# Patient Record
Sex: Female | Born: 1985 | Race: Black or African American | Hispanic: No | Marital: Single | Smoking: Never smoker
Health system: Southern US, Community
[De-identification: ages and names within clinical notes are randomized; demographics above are authoritative.]

## PROBLEM LIST (undated history)

## (undated) DIAGNOSIS — O009 Unspecified ectopic pregnancy without intrauterine pregnancy: Secondary | ICD-10-CM

## (undated) HISTORY — PX: ABDOMINAL SURGERY: SHX537

## (undated) HISTORY — PX: BREAST REDUCTION SURGERY: SHX8

---

## 2007-11-03 HISTORY — PX: UNILATERAL SALPINGECTOMY: SHX6160

## 2014-01-16 ENCOUNTER — Emergency Department (HOSPITAL_COMMUNITY)
Admission: EM | Admit: 2014-01-16 | Discharge: 2014-01-16 | Disposition: A | Discharge status: Home / Self Care | Payer: Medicaid Other | Source: Home / Self Care | Attending: Family Medicine | Admitting: Family Medicine

## 2014-01-16 ENCOUNTER — Encounter (HOSPITAL_COMMUNITY): Payer: Self-pay | Admitting: Emergency Medicine

## 2014-01-16 DIAGNOSIS — K0889 Other specified disorders of teeth and supporting structures: Secondary | ICD-10-CM

## 2014-01-16 DIAGNOSIS — K089 Disorder of teeth and supporting structures, unspecified: Secondary | ICD-10-CM

## 2014-01-16 MED ORDER — PENICILLIN V POTASSIUM 500 MG PO TABS
500.0000 mg | ORAL_TABLET | Freq: Three times a day (TID) | ORAL | Status: DC
Start: 2014-01-16 — End: 2014-06-22

## 2014-01-16 MED ORDER — HYDROCODONE-ACETAMINOPHEN 5-325 MG PO TABS: 1.0000 | ORAL_TABLET | Freq: Three times a day (TID) | ORAL | Status: DC | PRN

## 2014-01-16 NOTE — ED Provider Notes (Signed)
CSN: [id number]     Arrival date & time 01/16/14  1924 History   First MD Initiated Contact with Patient 01/16/14 2019     No chief complaint on file.  (Consider location/radiation/quality/duration/timing/severity/associated sxs/prior Treatment) HPI Patient presents to urgent care for complaint of bottom right sided dental pain. Pain started about one month ago but progressively worsening. She has Medicaid [place] [facility] [place] so she has not seen a dentist yet here. She was seen by a dentist in [place] prior to moving who stated she needed teeth pulled on both sides. He pulled the teeth on the left side, but postponed the right sided extraction. She moved prior to returning to the dentist to get the rest of teeth pulled. Tried ibuprofen and Excedrin with no relief. No fevers, no chills, no swelling, no gum bleeding, no pus.   No past medical history on file. No past surgical history on file. No family history on file. History  Substance Use Topics  . Smoking status: Not on file  . Smokeless tobacco: Not on file  . Alcohol Use: Not on file   OB History   No data available     Review of Systems  Constitutional: Negative for fever and chills.  HENT: Positive for dental problem. Negative for congestion, drooling and mouth sores.   Eyes: Negative for visual disturbance.  Respiratory: Negative for cough and shortness of breath.   Cardiovascular: Negative for chest pain and leg swelling.  Gastrointestinal: Negative for abdominal pain.  Genitourinary: Negative for dysuria.  Musculoskeletal: Negative for arthralgias and myalgias.  Skin: Negative for rash.  Neurological: Negative for headaches.    Allergies  Review of patient's allergies indicates not on file.  Home Medications   Current Outpatient Rx  Name  Route  Sig  Dispense  Refill  . HYDROcodone-acetaminophen (NORCO) 5-325 MG per tablet   Oral   Take 1 tablet by mouth every 8 (eight) hours as needed  for moderate pain.   30 tablet   0   . penicillin v potassium (VEETID) 500 MG tablet   Oral   Take 1 tablet (500 mg total) by mouth 3 (three) times daily.   60 tablet   0    BP 126/89  Pulse 100  Temp(Src) 99.3 F (37.4 C) (Oral)  Resp 20 Physical Exam  Constitutional: She is oriented to person, place, and time. She appears well-developed and well-nourished. No distress.  HENT:  Head: Normocephalic and atraumatic.  Mouth/Throat: Oropharynx is clear and moist.  Right lower gum line without redness or swelling. No TTP of jaw. Good ROM of jaw. No LAD.  Eyes: Pupils are equal, round, and reactive to light.  Neck: Normal range of motion. Neck supple.  Cardiovascular: Normal rate, regular rhythm and normal heart sounds.   No murmur heard. Pulmonary/Chest: Effort normal and breath sounds normal. She has no wheezes.  Abdominal: Soft. She exhibits no distension. There is no tenderness.  Musculoskeletal: Normal range of motion. She exhibits no edema and no tenderness.  Neurological: She is alert and oriented to person, place, and time.  Skin: Skin is warm and dry.  Psychiatric: She has a normal mood and affect.    ED Course  Procedures (including critical care time) Labs Review Labs Reviewed - No data to display Imaging Review No results found.  MDM   1. Pain, dental    Patient with acutely worsening chronic dental pain. Has dental appt on April 8. - Pen V  for possible developing periodontal infection given worsening pain - Norco prn pain - Given resource guide to call for an appointment if April 8 is too long to wait - F/u if develops fever, worsening pain, or notices swelling.    Hilarie Fredrickson, MD 01/16/14 [phone]

## 2014-01-16 NOTE — Discharge Instructions (Signed)
Dental Pain °Toothache is pain in or around a tooth. It may get worse with chewing or with cold or heat.  °HOME CARE °· Your dentist may use a numbing medicine during treatment. If so, you may need to avoid eating until the medicine wears off. Ask your dentist about this. °· Only take medicine as told by your dentist or doctor. °· Avoid chewing food near the painful tooth until after all treatment is done. Ask your dentist about this. °GET HELP RIGHT AWAY IF:  °· The problem gets worse or new problems appear. °· You have a fever. °· There is redness and puffiness (swelling) of the face, jaw, or neck. °· You cannot open your mouth. °· There is pain in the jaw. °· There is very bad pain that is not helped by medicine. °MAKE SURE YOU:  °· Understand these instructions. °· Will watch your condition. °· Will get help right away if you are not doing well or get worse. °Document Released: 04/06/2008 Document Revised: 01/11/2012 Document Reviewed: 04/06/2008 °ExitCare® Patient Information ©2014 ExitCare, LLC. ° ° °Emergency Department Resource Guide °1) Find a Doctor and Pay Out of Pocket °Although you won't have to find out who is covered by your insurance plan, it is a good idea to ask around and get recommendations. You will then need to call the office and see if the doctor you have chosen will accept you as a new patient and what types of options they offer for patients who are self-pay. Some doctors offer discounts or will set up payment plans for their patients who do not have insurance, but you will need to ask so you aren't surprised when you get to your appointment. ° °2) Contact Your Local Health Department °Not all health departments have doctors that can see patients for sick visits, but many do, so it is worth a call to see if yours does. If you don't know where your local health department is, you can check in your phone book. The CDC also has a tool to help you locate your state's health department, and many  state websites also have listings of all of their local health departments. ° °3) Find a Walk-in Clinic °If your illness is not likely to be very severe or complicated, you may want to try a walk in clinic. These are popping up all over the country in pharmacies, drugstores, and shopping centers. They're usually staffed by nurse practitioners or physician assistants that have been trained to treat common illnesses and complaints. They're usually fairly quick and inexpensive. However, if you have serious medical issues or chronic medical problems, these are probably not your best option. ° °No Primary Care Doctor: °- Call [facility] at  832-8000 - they can help you locate a primary care doctor that  accepts your insurance, provides certain services, etc. °- Physician Referral Service- 1-800-533-3463 ° °Chronic Pain Problems: °Organization         Address  Phone   Notes  °[facility]  (336) 297-2271 Patients need to be referred by their primary care doctor.  ° °Medication Assistance: °Organization         Address  Phone   Notes  °[place] [facility] [address] °[place], [place] [address] (336) 641-8030 --Must be a resident of [place] °-- Must have NO insurance coverage whatsoever (no Medicaid/ Medicare, etc.) °-- The pt. MUST have a primary care doctor that directs their care regularly and follows   them in the community °  °[facility]  (866) 331-1348   °[employer]  (888) 892-1162   ° °Agencies that provide inexpensive medical care: °Organization         Address  Phone   Notes  °[facility]  (336) 832-8035   °[facility]    (336) 832-7272   °[facility] [address] °[place], [place] [address] (336) 832-4777   °[facility] [address], °[place] (336) 271-4999   °[facility]    (336) 373-0678   °[facility]    (336) 272-1050   °[facility] ° [address], [place] Phone:  (336) 832-4444, Fax:  (336) 832-4440 Hours of Operation:  9 am - 6 pm, M-F.  Also accepts Medicaid/Medicare and self-pay.  °[facility] ° [address], [place] Phone: (336) 832-3150, Fax: (336) 832-3151. Hours of Operation:  8:30 am - 5:30 pm, M-F.  Also accepts Medicaid and self-pay.  °[facility] [place] [address], [place] Phone: (336) 878-6027   °[facility] [address], [place], [place] (336)723-1848, Ext. 123 Mondays & Thursdays: 7-9 AM.  First 15 patients are seen on a first come, first serve basis. °  ° °Medicaid-accepting [place] Providers: ° °Organization         Address  Phone   Notes  °[facility] [address], [place] (336) 641-2100 Also accepts self-pay patients.  °[facility] [address], [place] ° (336) 856-9996   °[facility] [address], [place] (336) 288-8857   °[facility] [address] [place] [address], [place] (336) 299-7000   °Veita Bland [address], [place]  ° (336) 373-1557 Only accepts [place] Access Medicaid patients after they have their name applied to their card.  ° °Self-Pay (no insurance) in [place]: ° °Organization         Address  Phone   Notes  °Sickle Cell Patients, [facility] [address], [place] (336) 832-1970   °[facility] [address], [place] (336) 832-4400   °[facility] ° [address], [place] (336) 992-4800   °[facility]/Dr. Osei-Bonsu ° [address] [place] [address], [place] or [address], [place] (336) 841-8500 Phone number for both [place] and [place] locations is the same.  °[facility] [address], [place] (336) 299-0000   °[facility] [address]  [place] [address], [place] or [address] (336) 852-7530 °(336) 878-2260   °[facility] [address], [place] (336) 350-1642, phone; (336) 294-5005, fax Sees patients 1st and 3rd Saturday of every month.  Must not qualify for public or private insurance (i.e. Medicaid, Medicare, [place] Health Choice, Veterans' Benefits) • Household income should be no more than 200% of the poverty level •The clinic cannot treat you if you are pregnant or think you are pregnant • Sexually transmitted diseases are not treated at the clinic.  ° ° °Dental Care: °Organization         Address  Phone  Notes  °[facility] [facility] [address], [place] (336) 641-6152 Accepts children up to age 21 who are enrolled   in Medicaid or [place] Health Choice; pregnant women with a Medicaid card; and children who have applied for Medicaid or [place] Health Choice, but were declined, whose parents can pay a reduced fee at time of service.  °[facility] [place]  [address], [place] (336) 641-7733 Accepts children up to age 21 who are enrolled in Medicaid or [place] Health Choice; pregnant women with a Medicaid card; and children who have applied for Medicaid or [place] Health Choice, but were declined, whose parents can pay a reduced fee at time of service.  °[facility] ° [address], [place] (336) 641-4533 Patients are seen by appointment only. Walk-ins are not accepted. [facility] will see patients 18 years of age and older. °Monday - Tuesday (8am-5pm) °Most Wednesdays (8:30-5pm) °$30 per visit, cash only  °[facility] ° [address], [place] (336) 641-4533 Patients are seen by appointment only. Walk-ins are not accepted. [facility] will see patients 18 years of age and older. °One Wednesday Evening (Monthly: Volunteer Based).  $30 per visit, cash only  °[facility]  (919) 537-3737 for adults; Children under age 4, call Graduate Pediatric Dentistry at (919) [phone]. Children aged 4-14, please call (919) [phone] to request a pediatric application. ° Dental services are provided in all areas of dental care including fillings, crowns and bridges, complete and partial dentures, implants, gum treatment, root canals, and extractions. Preventive care is also provided. Treatment is provided to both adults and children. °Patients are selected via a lottery and there is often a waiting list. °  °[facility] [address], °[place] ° (336) 763-8833 [email or web address] °  °[facility] [address], [place], [place] (336)723-1848, Ext. 123 Second and Fourth Thursday of each month, opens at 6:30 AM; Clinic ends at 9 AM.  Patients are seen on a first-come first-served basis, and a limited number are seen during each clinic.  ° °[facility] ° [address], [place], [place] (336) 723-7904   Eligibility Requirements °You must have lived in [place], or [place] counties for at least the last three months. °  You cannot be eligible for state or federal sponsored healthcare insurance, including Veterans Administration, Medicaid, or Medicare. °  You generally cannot be eligible for healthcare insurance through your employer.  °  How to apply: °Eligibility screenings are held every Tuesday and Wednesday afternoon from 1:00 pm until 4:00 pm. You do not need an appointment for the interview!  °[facility] [address], [place], [place] 336-631-2330   °[facility] [facility]  336-342-8273   °[facility]  336-703-3100   ° [facility]  336-570-6415   ° °Behavioral Health Resources in the Community: °Intensive Outpatient Programs °Organization         Address  Phone  Notes  °[facility] [address], [place], [place]  336-878-6098   °[facility] [address], [place], [place] 336-832-9800   °[facility] [address], [place], [place] ° 336-882-2125   °[facility] [address],  °[place],  1-800-853-5163 or 336-641-4981   °Substance Abuse Resources °Organization         Address  Phone  Notes  °[facility]  336-882-2125   °[facility]  336-784-9470   °  [facility]  336-285-9073   °[facility]  336-845-3988   °Residential & Outpatient Substance Abuse Program  1-800-659-3381   °Psychological Services °Organization         Address  Phone  Notes  °[facility]  336- 832-9600   °[facility]  336- 378-7881   °[facility] [address], [place] 1-800-853-5163 or 336-641-4981   ° °Mobile Crisis Teams °Organization         Address  Phone  Notes  °[facility], Mobile Crisis Care Unit  1-877-626-1772   °Assertive °[facility] ° [address] [place], [place] 336-834-9664   °Sharon DeEsch [address] °[place] 336-554-5454   ° °Self-Help/Support Groups °Organization         Address  Phone             Notes  °[facility] - variety of support groups  336- 373-1402 Call for more information  °Narcotics Anonymous (NA), [facility] [address], °[place]  2 meetings at this location  ° °Residential Treatment Programs °Organization         Address  Phone  Notes  °[facility] [address],    °[place]  1-866-801-8205   °[facility] ° [address], [place], [place] 704-293-8524   °[facility] [address], [place] 336-845-3988 Admissions: 8am-3pm M-F  °[facility] [address]    °[place], [place] 336-841-1104   °[facility] [address], [place], [place] 336-379-7146   °[facility] [address]    °[place], [place] 336-285-9073   °[facility] Intensive Outpatient [address], [place], [place] 336-852-3033   °[facility] [address]  °[place], [place] 1-877-615-2722 or 336-784-9470   °[facility] ([facility]) [address] [place], [place] 336-227-7417 Accepts Medicaid  °[facility] [address]  ° [place] 1-800-659-3381 Substance Abuse/Addiction Treatment  ° °[place] Behavioral Health Resources °Organization         Address  Phone  Notes  °[facility]  (888) 581-9988   °Julie Brannon, PhD [address] [place], [place]   (336) 349-5553 or (336) 951-0000   °[facility]   [address] °[place], [place] (336) 349-4454   °[facility] [address], [place], [place] (336) 342-8316 Insurance/Medicaid/sponsorship through [facility]  °[facility] [address]                                    [place], [place] (336) 342-8316 Therapy/tele-psych/case  °[facility] [address]  ° [place], [place] (336) 349-2233    °Dr. Arfeen  (336) 349-4544   °[facility] [place]  [employer] [place] [facility] 1) [address], [place] °2) [address], [place] °3)  [address], [place] (336) 349-3220 °(336) 342-7768 ° °(336) 342-8140   °[place] Child Abuse [facility] (336) 342-1394 or (336) 342-3537 (After Hours)    ° ° ° °

## 2014-01-16 NOTE — ED Notes (Signed)
Patient complains of right bottom dental pain

## 2014-01-17 NOTE — ED Provider Notes (Signed)
Medical screening examination/treatment/procedure(s) were performed by a resident physician or non-physician practitioner and as the supervising physician I was immediately available for consultation/collaboration.  Jonne Rote, MD    Lania Zawistowski S Anwar Sakata, MD 01/17/14 0740 

## 2014-06-22 ENCOUNTER — Emergency Department (HOSPITAL_COMMUNITY)
Admission: EM | Admit: 2014-06-22 | Discharge: 2014-06-22 | Disposition: A | Discharge status: Home / Self Care | Payer: Medicaid Other | Source: Home / Self Care | Attending: Family Medicine | Admitting: Family Medicine

## 2014-06-22 ENCOUNTER — Encounter (HOSPITAL_COMMUNITY): Payer: Self-pay | Admitting: Emergency Medicine

## 2014-06-22 DIAGNOSIS — J069 Acute upper respiratory infection, unspecified: Secondary | ICD-10-CM

## 2014-06-22 DIAGNOSIS — R05 Cough: Secondary | ICD-10-CM

## 2014-06-22 DIAGNOSIS — R059 Cough, unspecified: Secondary | ICD-10-CM

## 2014-06-22 DIAGNOSIS — B9789 Other viral agents as the cause of diseases classified elsewhere: Principal | ICD-10-CM

## 2014-06-22 MED ORDER — IPRATROPIUM BROMIDE 0.06 % NA SOLN: 2.0000 | Freq: Four times a day (QID) | NASAL | Status: DC

## 2014-06-22 MED ORDER — GUAIFENESIN-CODEINE 100-10 MG/5ML PO SOLN: 5.0000 mL | Freq: Every evening | ORAL | Status: DC | PRN

## 2014-06-22 MED ORDER — PREDNISONE 10 MG PO TABS: 30.0000 mg | ORAL_TABLET | Freq: Every day | ORAL | Status: DC

## 2014-06-22 MED ORDER — TRAMADOL HCL 50 MG PO TABS: 50.0000 mg | ORAL_TABLET | Freq: Every evening | ORAL | Status: DC | PRN

## 2014-06-22 NOTE — Discharge Instructions (Signed)
Thank you for coming in today. Take prednisone as directed for 5 days. Use Atrovent nasal spray. Use codeine cough medicine for cough as needed. Do not take and try for work. Call or go to the emergency room if you get worse, have trouble breathing, have chest pains, or palpitations.    Cough, Adult  A cough is a reflex that helps clear your throat and airways. It can help heal the body or may be a reaction to an irritated airway. A cough may only last 2 or 3 weeks (acute) or may last more than 8 weeks (chronic).  CAUSES Acute cough:  Viral or bacterial infections. Chronic cough:  Infections.  Allergies.  Asthma.  Post-nasal drip.  Smoking.  Heartburn or acid reflux.  Some medicines.  Chronic lung problems (COPD).  Cancer. SYMPTOMS   Cough.  Fever.  Chest pain.  Increased breathing rate.  High-pitched whistling sound when breathing (wheezing).  Colored mucus that you cough up (sputum). TREATMENT   A bacterial cough may be treated with antibiotic medicine.  A viral cough must run its course and will not respond to antibiotics.  Your caregiver may recommend other treatments if you have a chronic cough. HOME CARE INSTRUCTIONS   Only take over-the-counter or prescription medicines for pain, discomfort, or fever as directed by your caregiver. Use cough suppressants only as directed by your caregiver.  Use a cold steam vaporizer or humidifier in your bedroom or home to help loosen secretions.  Sleep in a semi-upright position if your cough is worse at night.  Rest as needed.  Stop smoking if you smoke. SEEK IMMEDIATE MEDICAL CARE IF:   You have pus in your sputum.  Your cough starts to worsen.  You cannot control your cough with suppressants and are losing sleep.  You begin coughing up blood.  You have difficulty breathing.  You develop pain which is getting worse or is uncontrolled with medicine.  You have a fever. MAKE SURE YOU:   Understand  these instructions.  Will watch your condition.  Will get help right away if you are not doing well or get worse. Document Released: 04/17/2011 Document Revised: 01/11/2012 Document Reviewed: 04/17/2011 [facility] Patient Information 2015 [place] Gardens[place]Care, [place]. This information is not intended to replace advice given to you by your health care provider. Make sure you discuss any questions you have with your health care provider.

## 2014-06-22 NOTE — ED Provider Notes (Addendum)
Annette KearnsSierra Floyd is a 28 y.o. female who presents to Urgent Care today for cough. Patient is a 2 day history of mild dry cough associated with a runny nose. No fevers chills vomiting or diarrhea. Patient has mild nausea and headache. She denies any shortness of breath chest pains or palpitations. She's tried some over-the-counter cough medications which have helped a little bit. Her symptoms are mild to moderate.   History reviewed. No pertinent past medical history. History  Substance Use Topics  . Smoking status: Never Smoker   . Smokeless tobacco: Not on file  . Alcohol Use: No   ROS as above Medications: No current facility-administered medications for this encounter.   Current Outpatient Prescriptions  Medication Sig Dispense Refill  . ipratropium (ATROVENT) 0.06 % nasal spray Place 2 sprays into both nostrils 4 (four) times daily.  15 mL  1  . predniSONE (DELTASONE) 10 MG tablet Take 3 tablets (30 mg total) by mouth daily.  15 tablet  0  . traMADol (ULTRAM) 50 MG tablet Take 1 tablet (50 mg total) by mouth at bedtime as needed (cough).  10 tablet  0    Exam:  BP 114/80  Pulse 103  Temp(Src) 98.5 F (36.9 C) (Oral)  Resp 20  SpO2 100%  LMP 05/29/2014 Gen: Well NAD HEENT: EOMI,  MMM posterior pharynx with cobblestoning. Normal tympanic membranes bilaterally. Lungs: Normal work of breathing. CTABL Heart: RRR no MRG Abd: NABS, Soft. Nondistended, Nontender Exts: Brisk capillary refill, warm and well perfused.   No results found for this or any previous visit (from the past 24 hour(s)). No results found.  Assessment and Plan: 28 y.o. female with viral URI with cough. Plenitude with prednisone, codeine containing cough medication for cough and Atrovent nasal spray.  Discussed warning signs or symptoms. Please see discharge instructions. Patient expresses understanding.   This note was created using Conservation officer, historic buildingsDragon voice recognition software. Any transcription errors are unintended.     Rodolph BongEvan S Maury Bamba, MD 06/22/14 1021  Rodolph BongEvan S Vedanth Sirico, MD 06/22/14 1027

## 2014-06-22 NOTE — ED Notes (Signed)
C/o cold sx onset 2 days Sx include: HA, dry cough, congestion Denies f/v/d Taking OTC cold meds w/no relief Alert, no signs of acute distress.

## 2015-07-12 ENCOUNTER — Encounter (HOSPITAL_COMMUNITY): Payer: Self-pay

## 2015-07-12 ENCOUNTER — Emergency Department (HOSPITAL_COMMUNITY)
Admission: EM | Admit: 2015-07-12 | Discharge: 2015-07-12 | Discharge status: Against Medical Advice | Payer: Medicare Other | Attending: Emergency Medicine | Admitting: Emergency Medicine

## 2015-07-12 DIAGNOSIS — R0602 Shortness of breath: Secondary | ICD-10-CM | POA: Diagnosis not present

## 2015-07-12 DIAGNOSIS — R531 Weakness: Secondary | ICD-10-CM | POA: Insufficient documentation

## 2015-07-12 DIAGNOSIS — R109 Unspecified abdominal pain: Secondary | ICD-10-CM | POA: Insufficient documentation

## 2015-07-12 DIAGNOSIS — R110 Nausea: Secondary | ICD-10-CM | POA: Insufficient documentation

## 2015-07-12 HISTORY — DX: Unspecified ectopic pregnancy without intrauterine pregnancy: O00.90

## 2015-07-12 NOTE — ED Notes (Signed)
Multiple Complaints:  Pt states abdominal pain, bloating, shortness of breath, weakness, nausea.  No fever.  No change in urination.  No vaginal bleeding or discharge.

## 2015-07-12 NOTE — ED Notes (Addendum)
Called pt for lab draw, no answer.   Notified nurse

## 2015-07-12 NOTE — ED Notes (Signed)
Pt continues to not be in lobby.

## 2015-09-16 ENCOUNTER — Encounter (HOSPITAL_COMMUNITY): Payer: Self-pay | Admitting: *Deleted

## 2015-09-16 ENCOUNTER — Emergency Department (HOSPITAL_COMMUNITY)
Admission: EM | Admit: 2015-09-16 | Discharge: 2015-09-16 | Discharge status: Against Medical Advice | Payer: Medicare Other | Attending: Emergency Medicine | Admitting: Emergency Medicine

## 2015-09-16 DIAGNOSIS — R220 Localized swelling, mass and lump, head: Secondary | ICD-10-CM | POA: Insufficient documentation

## 2015-09-16 NOTE — ED Notes (Signed)
Patient left ER prior to being seen.  Patient displayed frustration to staff for being placed in a hall bed.  Patient was then observed leaving the ER.  Patient ambulating with steady gait in no obvious distress.

## 2015-09-16 NOTE — ED Notes (Signed)
Patient is alert and oriented x4.  She is complaining of a mass on the back of her scalp that started growing after in the last Few years.  Over the last few days she has noticed an increase in size and pain along with drainage.  Currently she rates her  Pain 8 of 10.

## 2015-12-16 ENCOUNTER — Emergency Department (HOSPITAL_COMMUNITY)
Admission: EM | Admit: 2015-12-16 | Discharge: 2015-12-17 | Disposition: A | Discharge status: Home / Self Care | Payer: Medicare Other | Attending: Emergency Medicine | Admitting: Emergency Medicine

## 2015-12-16 ENCOUNTER — Encounter (HOSPITAL_COMMUNITY): Payer: Self-pay | Admitting: Emergency Medicine

## 2015-12-16 DIAGNOSIS — Z79899 Other long term (current) drug therapy: Secondary | ICD-10-CM | POA: Diagnosis not present

## 2015-12-16 DIAGNOSIS — Z3A00 Weeks of gestation of pregnancy not specified: Secondary | ICD-10-CM | POA: Insufficient documentation

## 2015-12-16 DIAGNOSIS — O200 Threatened abortion: Secondary | ICD-10-CM | POA: Diagnosis not present

## 2015-12-16 DIAGNOSIS — O209 Hemorrhage in early pregnancy, unspecified: Secondary | ICD-10-CM | POA: Diagnosis present

## 2015-12-16 NOTE — ED Notes (Signed)
Pt states she took a home preg test 2 weeks ago and it was positive. [place] today showed no sac in uterus or fetus. LMP 11/04/15. Hx of ectopic pregnancy with L fallopian tube removal. Vaginal spotting today. RLQ pain. Alert and oriented.

## 2015-12-17 LAB — BASIC METABOLIC PANEL
Anion gap: 9 (ref 5–15)
BUN: 16 mg/dL (ref 6–20)
CO2: 28 mmol/L (ref 22–32)
Calcium: 9.9 mg/dL (ref 8.9–10.3)
Chloride: 103 mmol/L (ref 101–111)
Creatinine, Ser: 0.8 mg/dL (ref 0.44–1.00)
GFR calc Af Amer: >60 mL/min (ref >60)
GFR calc non Af Amer: >60 mL/min (ref >60)
GLUCOSE: 103 mg/dL — AB (ref 65–99)
POTASSIUM: 3.8 mmol/L (ref 3.5–5.1)
Sodium: 140 mmol/L (ref 135–145)

## 2015-12-17 LAB — CBC WITH DIFFERENTIAL/PLATELET
Basophils Absolute: 0 10*3/uL (ref 0.0–0.1)
Basophils Relative: 0 %
Eosinophils Absolute: 0.1 10*3/uL (ref 0.0–0.7)
Eosinophils Relative: 2 %
HCT: 33.8 % — ABNORMAL LOW (ref 36.0–46.0)
Hemoglobin: 11.5 g/dL — ABNORMAL LOW (ref 12.0–15.0)
LYMPHS ABS: 2.5 10*3/uL (ref 0.7–4.0)
LYMPHS PCT: 51 %
MCH: 32.5 pg (ref 26.0–34.0)
MCHC: 34 g/dL (ref 30.0–36.0)
MCV: 95.5 fL (ref 78.0–100.0)
MONOS PCT: 7 %
Monocytes Absolute: 0.4 10*3/uL (ref 0.1–1.0)
Neutro Abs: 2 10*3/uL (ref 1.7–7.7)
Neutrophils Relative %: 40 %
PLATELETS: 268 10*3/uL (ref 150–400)
RBC: 3.54 MIL/uL — AB (ref 3.87–5.11)
RDW: 13.2 % (ref 11.5–15.5)
WBC: 5 10*3/uL (ref 4.0–10.5)

## 2015-12-17 LAB — ABO/RH: ABO/RH(D): B POS

## 2015-12-17 LAB — HCG, QUANTITATIVE, PREGNANCY: hCG, Beta Chain, Quant, S: 2304 m[IU]/mL — ABNORMAL HIGH (ref <5)

## 2015-12-17 NOTE — Discharge Instructions (Signed)
Threatened Miscarriage A threatened miscarriage occurs when you have vaginal bleeding during your first 20 weeks of pregnancy but the pregnancy has not ended. If you have vaginal bleeding during this time, your health care provider will do tests to make sure you are still pregnant. If the tests show you are still pregnant and the developing baby (fetus) inside your womb (uterus) is still growing, your condition is considered a threatened miscarriage. A threatened miscarriage does not mean your pregnancy will end, but it does increase the risk of losing your pregnancy (complete miscarriage). CAUSES  The cause of a threatened miscarriage is usually not known. If you go on to have a complete miscarriage, the most common cause is an abnormal number of chromosomes in the developing baby. Chromosomes are the structures inside cells that hold all your genetic material. Some causes of vaginal bleeding that do not result in miscarriage include:  Having sex.  Having an infection.  Normal hormone changes of pregnancy.  Bleeding that occurs when an egg implants in your uterus. RISK FACTORS Risk factors for bleeding in early pregnancy include:  Obesity.  Smoking.  Drinking excessive amounts of alcohol or caffeine.  Recreational drug use. SIGNS AND SYMPTOMS  Light vaginal bleeding.  Mild abdominal pain or cramps. DIAGNOSIS  If you have bleeding with or without abdominal pain before 20 weeks of pregnancy, your health care provider will do tests to check whether you are still pregnant. One important test involves using sound waves and a computer (ultrasound) to create images of the inside of your uterus. Other tests include an internal exam of your vagina and uterus (pelvic exam) and measurement of your baby's heart rate.  You may be diagnosed with a threatened miscarriage if:  Ultrasound testing shows you are still pregnant.  Your baby's heart rate is strong.  A pelvic exam shows that the  opening between your uterus and your vagina (cervix) is closed.  Your heart rate and blood pressure are stable.  Blood tests confirm you are still pregnant. TREATMENT  No treatments have been shown to prevent a threatened miscarriage from going on to a complete miscarriage. However, the right home care is important.  HOME CARE INSTRUCTIONS   Make sure you keep all your appointments for prenatal care. This is very important.  Get plenty of rest.  Do not have sex or use tampons if you have vaginal bleeding.  Do not douche.  Do not smoke or use recreational drugs.  Do not drink alcohol.  Avoid caffeine. SEEK MEDICAL CARE IF:  You have light vaginal bleeding or spotting while pregnant.  You have abdominal pain or cramping.  You have a fever. SEEK IMMEDIATE MEDICAL CARE IF:  You have heavy vaginal bleeding.  You have blood clots coming from your vagina.  You have severe low back pain or abdominal cramps.  You have fever, chills, and severe abdominal pain. MAKE SURE YOU:  Understand these instructions.  Will watch your condition.  Will get help right away if you are not doing well or get worse.   This information is not intended to replace advice given to you by your health care provider. Make sure you discuss any questions you have with your health care provider.   Document Released: 10/19/2005 Document Revised: 10/24/2013 Document Reviewed: 08/15/2013 Elsevier Interactive Patient Education 2016 Else[employer] Inc.  Human Chorionic Gonadotropin Test Human chorionic gonadotropin (hCG) is a hormone produced during pregnancy by the cells that form the placenta. The placenta is the organ that  grows inside your womb (uterus) to nourish a developing baby. When you are pregnant, hCG starts to appear in your blood about 11 days after conception. It continues to go up for the first 8-11 weeks of pregnancy.  Your hCG level can be measured with several different types of tests. You  may have:  A urine test.  hCG is eliminated from your body by your kidneys, so a urine test is one way to check for this hormone.  A urine test only shows whether there is hCG in your urine. It does not measure how much.  You may have a urine test to find out whether you are pregnant.  A home pregnancy test detects whether there is hCG in your urine.  A qualitative blood test.  Like the urine test, this blood test only shows whether there is hCG in your blood. It does not measure how much.  You may have this type of blood test to find out whether you are pregnant.  A quantitative blood test.  This type of blood test measures the amount of hCG in your blood.  You may have this type of test to diagnose an abnormal pregnancy or determine whether you are at risk of, or have had, a failed pregnancy (miscarriage). PREPARATION FOR TEST For the urine test:  Limit your fluid intake before the urine test as directed by your health care provider.  Collect the sample the first time you urinate in the morning.  Let your health care provider know if you have blood in your urine. This may interfere with the test result. Some medicines may interfere with the urine and blood tests. Let your health care provider know about all the medicines you are taking. No additional preparation is required for the blood test.  RESULTS It is your responsibility to obtain your test results. Ask the lab or department performing the test when and how you will get your results. Talk to your health care provider if you have any questions about your test results. The results of the hCG urine test and the qualitative hCG blood test are either positive or negative. The results of the quantitative hCG blood test are reported as a number. hCG is measured in international units per liter (IU/L). Meaning of Negative Test Results A negative result on a urine or qualitative blood test could mean that you are not pregnant. It  could also mean the test was done too early to detect hCG. If you still have other signs of pregnancy, the test should be repeated. Meaning of Positive Test Results A positive result on the urine or qualitative blood tests means you are most likely pregnant. Your health care provider may confirm your pregnancy with an imaging study of the inside of your uterus at 5-6 weeks (ultrasound).  Range of Normal Values Ranges for normal values for the quantitative hCG blood test may vary among different labs and hospitals. You should always check with your health care provider after having lab work or other tests done to discuss whether your values are considered within normal limits.   Less than 5 IU/L means it is most likely you are not pregnant.  Greater than 25 IU/L means it is most likely you are pregnant. Meaning of Results Outside Normal Value Ranges If your hCG level on the quantitative test is not what would be expected, you may have the test again. It may also be important for your health care provider to know whether your hCG level  goes up or down over time. Common causes of results outside the normal range include:   Being pregnant with twins (hCG level is higher than expected).  Having an ectopic pregnancy (hCG rises more slowly than expected).  Miscarriage (hCG level falls).  Abnormal growths in the womb (hCG level is higher than expected).   This information is not intended to replace advice given to you by your health care provider. Make sure you discuss any questions you have with your health care provider.   Document Released: 11/20/2004 Document Revised: 11/09/2014 Document Reviewed: 01/23/2014 Elsevier Interactive Patient Education [employer]

## 2015-12-17 NOTE — ED Provider Notes (Signed)
CSN: [id number]     Arrival date & time 12/16/15  2228 History   First MD Initiated Contact with Patient 12/17/15 0225     Chief Complaint  Patient presents with  . Vaginal Bleeding     (Consider location/radiation/quality/duration/timing/severity/associated sxs/prior Treatment) HPI  Patient presents to the emergency department with a past medical history of ectopic pregnancy. She took a positive pregnancy test 2 weeks ago. Her last menstrual period was January 2 and it lasted for 5 days. She was seen at [facility] on February 13 due to being high-risk pregnancy and because she's having vaginal bleeding and they noted that they were unable to see an IUP, but did do an ultrasound of her ovaries and did not see sided gestational sac. The patient presents to the emergency today because her bleeding is mildly worse, spotting to her panty liner. She felt like at [facility] that they were rushing and did not look very hard and would like a second opinion.   ROS: The patient denies diaphoresis, fever, headache, weakness (general or focal), confusion, change of vision,  dysphagia, aphagia, shortness of breath,  abdominal pains, nausea, vomiting, diarrhea, lower extremity swelling, rash, neck pain, chest pain   Past Medical History  Diagnosis Date  . Ectopic pregnancy    Past Surgical History  Procedure Laterality Date  . Breast reduction surgery     History reviewed. No pertinent family history. Social History  Substance Use Topics  . Smoking status: Never Smoker   . Smokeless tobacco: None  . Alcohol Use: No   OB History    No data available     Review of Systems Review of Systems All other systems negative except as documented in the HPI. All pertinent positives and negatives as reviewed in the HPI.    Allergies  Review of patient's allergies indicates no known allergies.  Home Medications   Prior to Admission medications   Medication Sig Start Date End Date Taking? Authorizing Provider   acetaminophen (TYLENOL) 500 MG tablet Take 1,000 mg by mouth every 6 (six) hours as needed for headache.   Yes Historical Provider, MD  diphenhydrAMINE (BENADRYL) 25 mg capsule Take 25 mg by mouth every 6 (six) hours as needed for sleep.   Yes Historical Provider, MD  Prenatal Vit-Fe Fumarate-FA (PRENATAL MULTIVITAMIN) TABS tablet Take 1 tablet by mouth daily at 12 noon.   Yes Historical Provider, MD  guaiFENesin-codeine 100-10 MG/5ML syrup Take 5 mLs by mouth at bedtime as needed for cough. Patient not taking: Reported on 12/17/2015 06/22/14   Rodolph Bong, MD  ipratropium (ATROVENT) 0.06 % nasal spray Place 2 sprays into both nostrils 4 (four) times daily. Patient not taking: Reported on 12/17/2015 06/22/14   Rodolph Bong, MD  predniSONE (DELTASONE) 10 MG tablet Take 3 tablets (30 mg total) by mouth daily. Patient not taking: Reported on 12/17/2015 06/22/14   Rodolph Bong, MD   BP 118/80 mmHg  Pulse 85  Temp(Src) 98.1 F (36.7 C) (Oral)  Resp 20  SpO2 100%  LMP 12/05/2015 Physical Exam  Constitutional: She appears well-developed and well-nourished. No distress.  HENT:  Head: Normocephalic and atraumatic.  Eyes: Pupils are equal, round, and reactive to light.  Neck: Normal range of motion. Neck supple.  Cardiovascular: Normal rate and regular rhythm.   Pulmonary/Chest: Effort normal.  Abdominal: Soft.  Neurological: She is alert.  Skin: Skin is warm and dry.  Nursing note and vitals reviewed.   ED Course  Procedures (including critical  care time) Labs Review Labs Reviewed  CBC WITH DIFFERENTIAL/PLATELET - Abnormal; Notable for the following:    RBC 3.54 (*)    Hemoglobin 11.5 (*)    HCT 33.8 (*)    All other components within normal limits  BASIC METABOLIC PANEL - Abnormal; Notable for the following:    Glucose, Bld 103 (*)    All other components within normal limits  HCG, QUANTITATIVE, PREGNANCY - Abnormal; Notable for the following:    hCG, Beta Chain, Quant, S 2304 (*)     All other components within normal limits  POC URINE PREG, ED  ABO/RH    Imaging Review No results found. I have personally reviewed and evaluated these images and lab results as part of my medical decision-making.   EKG Interpretation None      MDM   Final diagnoses:  Threatened miscarriage in early pregnancy   The patient does not appear to be in any distress. She had a tendency quantitative done at [facility] today as well as an ultrasound to evaluate for gestational sac. She is approximated [redacted] weeks along in which case it may be too early to see an IUP or gestational sac or she may have miscarried. A long discussion about how the pregnancy quantitative blood test works and how she will need to have it rechecked in 48 hours for trend to see with a level has gone off her gone down. She voices her understanding and has been given strict return precautions.   Othewrise her labs are unremarkable. Discussed with Dr. Nicanor Alcon who agrees that no further work-up or imaging is needed at this time, pt really needs to make it to appointment on [facility] and has been made aware that she is diagnosed with threatened abortion.  Filed Vitals:   12/16/15 2240  BP: 118/80  Pulse: 85  Temp: 98.1 F (36.7 C)  Resp: [address]       Marlon Pel, PA-C 12/17/15 0344  April Palumbo, MD 12/17/15 [phone]

## 2015-12-28 ENCOUNTER — Inpatient Hospital Stay (HOSPITAL_COMMUNITY)
Admission: AD | Admit: 2015-12-28 | Discharge: 2015-12-28 | Disposition: A | Discharge status: Home / Self Care | Payer: Medicare Other | Source: Ambulatory Visit | Attending: Obstetrics & Gynecology | Admitting: Obstetrics & Gynecology

## 2015-12-28 ENCOUNTER — Inpatient Hospital Stay (HOSPITAL_COMMUNITY): Payer: Medicare Other

## 2015-12-28 ENCOUNTER — Encounter (HOSPITAL_COMMUNITY): Payer: Self-pay | Admitting: *Deleted

## 2015-12-28 ENCOUNTER — Emergency Department (HOSPITAL_COMMUNITY)
Admission: EM | Admit: 2015-12-28 | Discharge: 2015-12-28 | Disposition: A | Discharge status: Home / Self Care | Payer: Medicare Other

## 2015-12-28 DIAGNOSIS — R109 Unspecified abdominal pain: Secondary | ICD-10-CM | POA: Diagnosis not present

## 2015-12-28 DIAGNOSIS — O009 Unspecified ectopic pregnancy without intrauterine pregnancy: Secondary | ICD-10-CM | POA: Insufficient documentation

## 2015-12-28 DIAGNOSIS — O26891 Other specified pregnancy related conditions, first trimester: Secondary | ICD-10-CM | POA: Diagnosis not present

## 2015-12-28 DIAGNOSIS — O9989 Other specified diseases and conditions complicating pregnancy, childbirth and the puerperium: Secondary | ICD-10-CM | POA: Diagnosis not present

## 2015-12-28 DIAGNOSIS — Z79899 Other long term (current) drug therapy: Secondary | ICD-10-CM | POA: Diagnosis not present

## 2015-12-28 DIAGNOSIS — R102 Pelvic and perineal pain: Secondary | ICD-10-CM | POA: Insufficient documentation

## 2015-12-28 DIAGNOSIS — O26899 Other specified pregnancy related conditions, unspecified trimester: Secondary | ICD-10-CM

## 2015-12-28 DIAGNOSIS — R1031 Right lower quadrant pain: Secondary | ICD-10-CM | POA: Diagnosis not present

## 2015-12-28 DIAGNOSIS — Z3A01 Less than 8 weeks gestation of pregnancy: Secondary | ICD-10-CM

## 2015-12-28 DIAGNOSIS — Z9889 Other specified postprocedural states: Secondary | ICD-10-CM | POA: Diagnosis not present

## 2015-12-28 DIAGNOSIS — O3680X0 Pregnancy with inconclusive fetal viability, not applicable or unspecified: Secondary | ICD-10-CM

## 2015-12-28 LAB — URINALYSIS, ROUTINE W REFLEX MICROSCOPIC
BILIRUBIN URINE: NEGATIVE
Glucose, UA: NEGATIVE mg/dL
HGB URINE DIPSTICK: NEGATIVE
Ketones, ur: NEGATIVE mg/dL
Leukocytes, UA: NEGATIVE
NITRITE: NEGATIVE
PROTEIN: NEGATIVE mg/dL
Specific Gravity, Urine: <1.005 — ABNORMAL LOW (ref 1.005–1.030)
pH: 6.5 (ref 5.0–8.0)

## 2015-12-28 LAB — CBC
HEMATOCRIT: 35 % — AB (ref 36.0–46.0)
HEMOGLOBIN: 12.1 g/dL (ref 12.0–15.0)
MCH: 33.2 pg (ref 26.0–34.0)
MCHC: 34.6 g/dL (ref 30.0–36.0)
MCV: 96.2 fL (ref 78.0–100.0)
Platelets: 280 10*3/uL (ref 150–400)
RBC: 3.64 MIL/uL — ABNORMAL LOW (ref 3.87–5.11)
RDW: 13.4 % (ref 11.5–15.5)
WBC: 4.5 10*3/uL (ref 4.0–10.5)

## 2015-12-28 LAB — HCG, QUANTITATIVE, PREGNANCY: hCG, Beta Chain, Quant, S: 478 m[IU]/mL — ABNORMAL HIGH (ref <5)

## 2015-12-28 NOTE — Discharge Instructions (Signed)

## 2015-12-28 NOTE — Progress Notes (Signed)
Judeth Horn NP in to discuss test results and d/c plan with pt. [place] and verbal d/c instructions given and understanding voiced.

## 2015-12-28 NOTE — MAU Provider Note (Signed)
History     CSN: [id number]  Arrival date and time: 12/28/15 2116   First Provider Initiated Contact with Patient 12/28/15 2200       Chief Complaint  Patient presents with  . Abdominal Pain   HPI  Annette Floyd is a 30 y.o. [id number] at [redacted]w[redacted]d who presents for RLQ pain. Is currently being followed at [facility] for ectopic pregnancy. Reports intermittent pelvic pain for the last few weeks. Has opted no treatment for ectopic d/t dropping BHCGs. States tonight abdominal pain became worse after straining for a BM & is concerned that she may have ruptured the pregnancy. States pain is RLQ, sharp, & currently constant. Rates pain 8/10. Has not treated.  Denies n/v/d, fever, vaginal bleeding.    OB History    Gravida Para Term Preterm AB TAB SAB Ectopic Multiple Living   Past Medical History  Diagnosis Date  . Ectopic pregnancy     Past Surgical History  Procedure Laterality Date  . Breast reduction surgery    . Unilateral salpingectomy Left 2009    ectopic    History reviewed. No pertinent family history.  Social History  Substance Use Topics  . Smoking status: Never Smoker   . Smokeless tobacco: None  . Alcohol Use: No    Allergies: No Known Allergies  Prescriptions prior to admission  Medication Sig Dispense Refill Last Dose  . acetaminophen (TYLENOL) 500 MG tablet Take 1,000 mg by mouth every 6 (six) hours as needed for headache.   12/16/2015 at Unknown time  . diphenhydrAMINE (BENADRYL) 25 mg capsule Take 25 mg by mouth every 6 (six) hours as needed for sleep.   Past Week at Unknown time  . guaiFENesin-codeine 100-10 MG/5ML syrup Take 5 mLs by mouth at bedtime as needed for cough. (Patient not taking: Reported on 12/17/2015) 120 mL 0   . ipratropium (ATROVENT) 0.06 % nasal spray Place 2 sprays into both nostrils 4 (four) times daily. (Patient not taking: Reported on 12/17/2015) 15 mL 1   . predniSONE (DELTASONE) 10 MG tablet Take 3 tablets (30 mg total) by  mouth daily. (Patient not taking: Reported on 12/17/2015) 15 tablet 0   . Prenatal Vit-Fe Fumarate-FA (PRENATAL MULTIVITAMIN) TABS tablet Take 1 tablet by mouth daily at 12 noon.   12/16/2015 at Unknown time    Review of Systems  Constitutional: Negative.   Cardiovascular: Negative.   Gastrointestinal: Positive for abdominal pain and constipation. Negative for nausea, vomiting and diarrhea.  Genitourinary: Negative.   Neurological: Negative for loss of consciousness.   Physical Exam   Blood pressure 119/75, pulse 73, temperature 98.1 F (36.7 C), resp. rate 18, height  (1.702 m), weight 173 lb 12.8 oz (78.835 kg), last menstrual period 11/04/2015.  Physical Exam  Nursing note and vitals reviewed. Constitutional: She is oriented to person, place, and time. She appears well-developed and well-nourished. No distress.  HENT:  Head: Normocephalic and atraumatic.  Eyes: Conjunctivae are normal. Right eye exhibits no discharge. Left eye exhibits no discharge. No scleral icterus.  Neck: Normal range of motion.  Cardiovascular: Normal rate, regular rhythm and normal heart sounds.   No murmur heard. Respiratory: Effort normal and breath sounds normal. No respiratory distress. She has no wheezes.  GI: Soft. Bowel sounds are normal. She exhibits no distension. There is no tenderness. There is no rebound and no guarding.  Neurological: She is alert and oriented to person,  place, and time.  Skin: Skin is warm and dry. She is not diaphoretic.  Psychiatric: She has a normal mood and affect. Her behavior is normal. Judgment and thought content normal.    MAU Course  Procedures Results for orders placed or performed during the hospital encounter of 12/28/15 (from the past 24 hour(s))  Urinalysis, Routine w reflex microscopic (not at [facility])     Status: Abnormal   Collection Time: 12/28/15  9:45 PM  Result Value Ref Range   Color, Urine YELLOW YELLOW   APPearance CLEAR CLEAR   Specific Gravity,  Urine <1.005 (L) 1.005 - 1.030   pH 6.5 5.0 - 8.0   Glucose, UA NEGATIVE NEGATIVE mg/dL   Hgb urine dipstick NEGATIVE NEGATIVE   Bilirubin Urine NEGATIVE NEGATIVE   Ketones, ur NEGATIVE NEGATIVE mg/dL   Protein, ur NEGATIVE NEGATIVE mg/dL   Nitrite NEGATIVE NEGATIVE   Leukocytes, UA NEGATIVE NEGATIVE  CBC     Status: Abnormal   Collection Time: 12/28/15  9:53 PM  Result Value Ref Range   WBC 4.5 4.0 - 10.5 K/uL   RBC 3.64 (L) 3.87 - 5.11 MIL/uL   Hemoglobin 12.1 12.0 - 15.0 g/dL   HCT 69.6 (L) 29.5 - 28.4 %   MCV 96.2 78.0 - 100.0 fL   MCH 33.2 26.0 - 34.0 pg   MCHC 34.6 30.0 - 36.0 g/dL   RDW 13.2 44.0 - 10.2 %   Platelets 280 150 - 400 K/uL  hCG, quantitative, pregnancy     Status: Abnormal   Collection Time: 12/28/15  9:53 PM  Result Value Ref Range   hCG, Beta Chain, Quant, S 478 (H) <5 mIU/mL   US Ob Transvaginal  12/28/2015  CLINICAL DATA:  Pregnant patient in first-trimester pregnancy with right lower quadrant and back pain. Pregnancy of unknown location. EXAM: TRANSVAGINAL OB ULTRASOUND TECHNIQUE: Transvaginal ultrasound was performed for complete evaluation of the gestation as well as the maternal uterus, adnexal regions, and pelvic cul-de-sac. COMPARISON:  No images available. Report from ultrasound 12/20/2015 demonstrating hyperechoic ovoid mass in the right adnexa. FINDINGS: Intrauterine gestational sac: Not visualized. Yolk sac:  Not visualized. Embryo:  Not visualized. Cardiac Activity: Not visualized. Maternal uterus/adnexae: The endometrium is thin measuring 3 mm. The right ovary measures 1.4 x 1.9 x 2.0 cm. No fluid in the endometrial canal. Adjacent to the right ovary is an echogenic adnexal mass with central hypoechoic structure measuring 1.8 x 1.3 x 1.2 cm. There is internal blood flow. The left ovary is normal. There is no pelvic free fluid. IMPRESSION: Findings concerning for right adnexal ectopic pregnancy. No evidence of rupture. Critical Value/emergent results were  called by telephone at the time of interpretation on 12/28/2015 at 10:54 pm to NP Judeth Horn , who verbally acknowledged these results. Electronically Signed   By: Rubye Oaks M.D.   On: 12/28/2015 22:55    MDM B positive Per review of Care Everywhere: BHCG has been dropping. Initially 2300, last checked on 2/20 was 979. Ultrasound on 2/17 shows right adnexal mass suspicious for ectopic measuring 1.5x1.4x1.4 Spoke with radiologist tonight who states mass appears to be similar size to previous ultrasound & not concerning for rupture.  Patient reports resolution of pain since returning from ultrasound S/w Dr. Debroah Loop about patient & results. Tonight's BHCG is 478. Considering mass is same size & BHCG is dropping, ok to discharge patient home with strict precautions for return. Patient has f/u appt with [facility] on Monday.  Assessment and Plan  A: 1. Abdominal pain in pregnancy   2. Pregnancy of unknown anatomic location    P; Discharge home Strict ectopic precautions for return to MAU Keep scheduled f/u with Tera Helper 12/28/2015, 10:00 PM

## 2015-12-28 NOTE — MAU Note (Signed)
Pregnant but unsure where pregnancy is. Having pain RLQ, back and pelvic pain. L tube removed from tubal pregnancy. No bleeding

## 2016-02-18 ENCOUNTER — Encounter (HOSPITAL_COMMUNITY): Payer: Self-pay | Admitting: Emergency Medicine

## 2016-02-18 ENCOUNTER — Ambulatory Visit (HOSPITAL_COMMUNITY)
Admission: EM | Admit: 2016-02-18 | Discharge: 2016-02-18 | Disposition: A | Discharge status: Home / Self Care | Payer: Medicare Other | Attending: Family Medicine | Admitting: Family Medicine

## 2016-02-18 DIAGNOSIS — K0889 Other specified disorders of teeth and supporting structures: Secondary | ICD-10-CM | POA: Diagnosis not present

## 2016-02-18 MED ORDER — AMOXICILLIN 500 MG PO CAPS: 500.0000 mg | ORAL_CAPSULE | Freq: Three times a day (TID) | ORAL | Status: DC

## 2016-02-18 MED ORDER — OXYCODONE-ACETAMINOPHEN 10-325 MG PO TABS: 1.0000 | ORAL_TABLET | ORAL | Status: DC | PRN

## 2016-02-18 NOTE — ED Provider Notes (Signed)
CSN: [id number]     Arrival date & time 02/18/16  1404 History   First MD Initiated Contact with Patient 02/18/16 1547     Chief Complaint  Patient presents with  . Dental Pain   (Consider location/radiation/quality/duration/timing/severity/associated sxs/prior Treatment) HPI Dental pain for 2-3 days. OTC meds not helping. Pain score 4. Has appointment with dentist for May. Past Medical History  Diagnosis Date  . Ectopic pregnancy    Past Surgical History  Procedure Laterality Date  . Breast reduction surgery    . Unilateral salpingectomy Left 2009    ectopic   No family history on file. Social History  Substance Use Topics  . Smoking status: Never Smoker   . Smokeless tobacco: None  . Alcohol Use: No   OB History    Gravida Para Term Preterm AB TAB SAB Ectopic Multiple Living   Review of Systems Dental pain Allergies  Review of patient's allergies indicates no known allergies.  Home Medications   Prior to Admission medications   Medication Sig Start Date End Date Taking? Authorizing Provider  Biotin 10 MG CAPS Take 1 tablet by mouth daily.   Yes Historical Provider, MD  amoxicillin (AMOXIL) 500 MG capsule Take 1 capsule (500 mg total) by mouth 3 (three) times daily. 02/18/16   Tharon Aquas, PA  oxyCODONE-acetaminophen (PERCOCET) 10-325 MG tablet Take 1 tablet by mouth every 4 (four) hours as needed for pain. 02/18/16   Tharon Aquas, PA  Prenatal Vit-Fe Fumarate-FA (PRENATAL MULTIVITAMIN) TABS tablet Take 1 tablet by mouth daily.     Historical Provider, MD   Meds Ordered and Administered this Visit  Medications - No data to display  BP 114/81 mmHg  Pulse 71  Temp(Src) 98.3 F (36.8 C) (Oral)  Resp 12  SpO2 100%  LMP 02/14/2016  Breastfeeding? Unknown No data found.   Physical Exam  Constitutional: She is oriented to person, place, and time. She appears well-developed and well-nourished. No distress.  HENT:  Head: Normocephalic  and atraumatic.  Mouth/Throat:    Neck: Normal range of motion. Neck supple.  Pulmonary/Chest: Effort normal.  Musculoskeletal: Normal range of motion.  Neurological: She is alert and oriented to person, place, and time.  Skin: Skin is warm and dry.  Nursing note and vitals reviewed.   ED Course  Procedures (including critical care time)  Labs Review Labs Reviewed - No data to display  Imaging Review No results found.   Visual Acuity Review  Right Eye Distance:   Left Eye Distance:   Bilateral Distance:    Right Eye Near:   Left Eye Near:    Bilateral Near:      Rx: percocet/amoxil   MDM   1. Pain, dental     Patient is reassured that there are no issues that require transfer to higher level of care at this time or additional tests. Patient is advised to continue home symptomatic treatment. Patient is advised that if there are new or worsening symptoms to attend the emergency department, contact primary care provider, or return to UC. Instructions of care provided discharged home in stable condition.    THIS NOTE WAS GENERATED USING A VOICE RECOGNITION SOFTWARE PROGRAM. ALL REASONABLE EFFORTS  WERE MADE TO PROOFREAD THIS DOCUMENT FOR ACCURACY.  I have verbally reviewed the discharge instructions with the patient. A printed AVS was given to the patient.  All questions were answered prior to  discharge.      Tharon AquasFrank C Ines Rebel, PA 02/18/16 [phone]

## 2016-02-18 NOTE — ED Notes (Signed)
PT reports dental pain in upper left jaw. PT reports no previous problems with this tooth. PT has a dental appointment next month. PT reports she took tylenol, motrin, and excedrin in a six hour time period with no relief.

## 2016-02-18 NOTE — Discharge Instructions (Signed)
Dental Pain °Dental pain may be caused by many things, including: °· Tooth decay (cavities or caries). Cavities cause the nerve of your tooth to be open to air and hot or cold temperatures. This can cause pain or discomfort. °· Abscess or infection. A dental abscess is an area that is full of infected pus from a bacterial infection in the inner part of the tooth (pulp). It usually happens at the end of the tooth's root. °· Injury. °· An unknown reason (idiopathic). °Your pain may be mild or severe. It may only happen when: °· You are chewing. °· You are exposed to hot or cold temperature. °· You are eating or drinking sugary foods or beverages, such as: °¨ Soda. °¨ Candy. °Your pain may also be there all of the time. °HOME CARE °Watch your dental pain for any changes. Do these things to lessen your discomfort: °· Take medicines only as told by your dentist. °· If your dentist tells you to take an antibiotic medicine, finish all of it even if you start to feel better. °· Keep all follow-up visits as told by your dentist. This is important. °· Do not apply heat to the outside of your face. °· Rinse your mouth or gargle with salt water if told by your dentist. This helps with pain and swelling. °¨ You can make salt water by adding ¼ tsp of salt to 1 cup of warm water. °· Apply ice to the painful area of your face: °¨ Put ice in a plastic bag. °¨ Place a towel between your skin and the bag. °¨ Leave the ice on for 20 minutes, 2-3 times per day. °· Avoid foods or drinks that cause you pain, such as: °¨ Very hot or very cold foods or drinks. °¨ Sweet or sugary foods or drinks. °GET HELP IF: °· Your pain is not helped with medicines. °· Your symptoms are worse. °· You have new symptoms. °GET HELP RIGHT AWAY IF: °· You cannot open your mouth. °· You are having trouble breathing or swallowing. °· You have a fever. °· Your face, neck, or jaw is puffy (swollen). °  °This information is not intended to replace advice given to  you by your health care provider. Make sure you discuss any questions you have with your health care provider. °  °Document Released: 04/06/2008 Document Revised: 03/05/2015 Document Reviewed: 10/15/2014 °Elsevier Interactive Patient Education ©2016 Elsevier Inc. ° °

## 2016-05-22 ENCOUNTER — Emergency Department (HOSPITAL_COMMUNITY): Payer: Medicare Other

## 2016-05-22 ENCOUNTER — Emergency Department (HOSPITAL_COMMUNITY)
Admission: EM | Admit: 2016-05-22 | Discharge: 2016-05-22 | Disposition: A | Discharge status: Home / Self Care | Payer: Medicare Other | Attending: Emergency Medicine | Admitting: Emergency Medicine

## 2016-05-22 ENCOUNTER — Encounter (HOSPITAL_COMMUNITY): Payer: Self-pay | Admitting: Emergency Medicine

## 2016-05-22 DIAGNOSIS — Z79899 Other long term (current) drug therapy: Secondary | ICD-10-CM | POA: Insufficient documentation

## 2016-05-22 DIAGNOSIS — R079 Chest pain, unspecified: Secondary | ICD-10-CM | POA: Diagnosis present

## 2016-05-22 DIAGNOSIS — Z792 Long term (current) use of antibiotics: Secondary | ICD-10-CM | POA: Diagnosis not present

## 2016-05-22 LAB — CBC
HEMATOCRIT: 34.2 % — AB (ref 36.0–46.0)
HEMOGLOBIN: 11.5 g/dL — AB (ref 12.0–15.0)
MCH: 33.1 pg (ref 26.0–34.0)
MCHC: 33.6 g/dL (ref 30.0–36.0)
MCV: 98.6 fL (ref 78.0–100.0)
PLATELETS: 235 10*3/uL (ref 150–400)
RBC: 3.47 MIL/uL — AB (ref 3.87–5.11)
RDW: 14.2 % (ref 11.5–15.5)
WBC: 4.3 10*3/uL (ref 4.0–10.5)

## 2016-05-22 LAB — BASIC METABOLIC PANEL
ANION GAP: 6 (ref 5–15)
BUN: 17 mg/dL (ref 6–20)
CHLORIDE: 104 mmol/L (ref 101–111)
CO2: 29 mmol/L (ref 22–32)
CREATININE: 0.82 mg/dL (ref 0.44–1.00)
Calcium: 9.2 mg/dL (ref 8.9–10.3)
GFR calc non Af Amer: >60 mL/min (ref >60)
Glucose, Bld: 97 mg/dL (ref 65–99)
POTASSIUM: 4 mmol/L (ref 3.5–5.1)
SODIUM: 139 mmol/L (ref 135–145)

## 2016-05-22 LAB — I-STAT BETA HCG BLOOD, ED (MC, WL, AP ONLY)

## 2016-05-22 LAB — I-STAT TROPONIN, ED: Troponin i, poc: 0 ng/mL (ref 0.00–0.08)

## 2016-05-22 MED ORDER — IOPAMIDOL (ISOVUE-370) INJECTION 76%
100.0000 mL | Freq: Once | INTRAVENOUS | Status: AC | PRN
  Administered 2016-05-22: 80 mL via INTRAVENOUS

## 2016-05-22 NOTE — Discharge Instructions (Signed)
Please seek medical attention if you have any chest pain at rest.   You were seen and evaluated today for your chest pain. Please do not exercise until you are cleared by cardiology. Cardiology would likely to take a low dose aspirin every day until you see them. They should call you on Monday to arrange outpatient follow-up. If he do not hear from them early on Monday please call their office directly. Return for sudden unexplained chest pain at rest or worsening of symptoms.  Nonspecific Chest Pain  Chest pain can be caused by many different conditions. There is always a chance that your pain could be related to something serious, such as a heart attack or a blood clot in your lungs. Chest pain can also be caused by conditions that are not life-threatening. If you have chest pain, it is very important to follow up with your health care provider. CAUSES  Chest pain can be caused by:  Heartburn.  Pneumonia or bronchitis.  Anxiety or stress.  Inflammation around your heart (pericarditis) or lung (pleuritis or pleurisy).  A blood clot in your lung.  A collapsed lung (pneumothorax). It can develop suddenly on its own (spontaneous pneumothorax) or from trauma to the chest.  Shingles infection (varicella-zoster virus).  Heart attack.  Damage to the bones, muscles, and cartilage that make up your chest wall. This can include:  Bruised bones due to injury.  Strained muscles or cartilage due to frequent or repeated coughing or overwork.  Fracture to one or more ribs.  Sore cartilage due to inflammation (costochondritis). RISK FACTORS  Risk factors for chest pain may include:  Activities that increase your risk for trauma or injury to your chest.  Respiratory infections or conditions that cause frequent coughing.  Medical conditions or overeating that can cause heartburn.  Heart disease or family history of heart disease.  Conditions or health behaviors that increase your risk of  developing a blood clot.  Having had chicken pox (varicella zoster). SIGNS AND SYMPTOMS Chest pain can feel like:  Burning or tingling on the surface of your chest or deep in your chest.  Crushing, pressure, aching, or squeezing pain.  Dull or sharp pain that is worse when you move, cough, or take a deep breath.  Pain that is also felt in your back, neck, shoulder, or arm, or pain that spreads to any of these areas. Your chest pain may come and go, or it may stay constant. DIAGNOSIS Lab tests or other studies may be needed to find the cause of your pain. Your health care provider may have you take a test called an ambulatory ECG (electrocardiogram). An ECG records your heartbeat patterns at the time the test is performed. You may also have other tests, such as:  Transthoracic echocardiogram (TTE). During echocardiography, sound waves are used to create a picture of all of the heart structures and to look at how blood flows through your heart.  Transesophageal echocardiogram (TEE).This is a more advanced imaging test that obtains images from inside your body. It allows your health care provider to see your heart in finer detail.  Cardiac monitoring. This allows your health care provider to monitor your heart rate and rhythm in real time.  Holter monitor. This is a portable device that records your heartbeat and can help to diagnose abnormal heartbeats. It allows your health care provider to track your heart activity for several days, if needed.  Stress tests. These can be done through exercise or by taking  medicine that makes your heart beat more quickly.  Blood tests.  Imaging tests. TREATMENT  Your treatment depends on what is causing your chest pain. Treatment may include:  Medicines. These may include:  Acid blockers for heartburn.  Anti-inflammatory medicine.  Pain medicine for inflammatory conditions.  Antibiotic medicine, if an infection is present.  Medicines to  dissolve blood clots.  Medicines to treat coronary artery disease.  Supportive care for conditions that do not require medicines. This may include:  Resting.  Applying heat or cold packs to injured areas.  Limiting activities until pain decreases. HOME CARE INSTRUCTIONS  If you were prescribed an antibiotic medicine, finish it all even if you start to feel better.  Avoid any activities that bring on chest pain.  Do not use any tobacco products, including cigarettes, chewing tobacco, or electronic cigarettes. If you need help quitting, ask your health care provider.  Do not drink alcohol.  Take medicines only as directed by your health care provider.  Keep all follow-up visits as directed by your health care provider. This is important. This includes any further testing if your chest pain does not go away.  If heartburn is the cause for your chest pain, you may be told to keep your head raised (elevated) while sleeping. This reduces the chance that acid will go from your stomach into your esophagus.  Make lifestyle changes as directed by your health care provider. These may include:  Getting regular exercise. Ask your health care provider to suggest some activities that are safe for you.  Eating a heart-healthy diet. A registered dietitian can help you to learn healthy eating options.  Maintaining a healthy weight.  Managing diabetes, if necessary.  Reducing stress. SEEK MEDICAL CARE IF:  Your chest pain does not go away after treatment.  You have a rash with blisters on your chest.  You have a fever. SEEK IMMEDIATE MEDICAL CARE IF:   Your chest pain is worse.  You have an increasing cough, or you cough up blood.  You have severe abdominal pain.  You have severe weakness.  You faint.  You have chills.  You have sudden, unexplained chest discomfort.  You have sudden, unexplained discomfort in your arms, back, neck, or jaw.  You have shortness of breath at  any time.  You suddenly start to sweat, or your skin gets clammy.  You feel nauseous or you vomit.  You suddenly feel light-headed or dizzy.  Your heart begins to beat quickly, or it feels like it is skipping beats. These symptoms may represent a serious problem that is an emergency. Do not wait to see if the symptoms will go away. Get medical help right away. Call your local emergency services (911 in the [place] Do not drive yourself to the hospital.   This information is not intended to replace advice given to you by your health care provider. Make sure you discuss any questions you have with your health care provider.   Document Released: 07/29/2005 Document Revised: 11/09/2014 Document Reviewed: 05/25/2014 Elsevier Interactive Patient Education [employer]2016 Elsevier Inc.

## 2016-05-22 NOTE — ED Notes (Signed)
Per pt, states chest pain under left breast-states increases when she exercises-has been going on for 2 months-worse past week

## 2016-05-22 NOTE — ED Provider Notes (Signed)
CSN: [id number]     Arrival date & time 05/22/16  1304 History   First MD Initiated Contact with Patient 05/22/16 1421     Chief Complaint  Patient presents with  . Chest Pain     (Consider location/radiation/quality/duration/timing/severity/associated sxs/prior Treatment) HPI Comments: 30 year old female presents for chest pain. The patient reports that over the last 2 months she has had pain in the left side of her chest. She states that initially was coming after she was walking in that it was mild. She reports that recently she started exercising more vigorously and the pain has gotten worse. It usually occurs after she has started exercising. She denies shortness of breath. She does report that she recently had abdominoplasty. She denies having chest pain like this previously. No fevers or chills. No cough. She presently does not have chest pain but states that if she walked around the emergency department she would develop chest pain. She reports that she had a cousin who died suddenly on the basketball court in his late teens/early 42s, heart-related issue. She also had an uncle who died at an early age from a heart condition.   Past Medical History  Diagnosis Date  . Ectopic pregnancy    Past Surgical History  Procedure Laterality Date  . Breast reduction surgery    . Unilateral salpingectomy Left 2009    ectopic   No family history on file. Social History  Substance Use Topics  . Smoking status: Never Smoker   . Smokeless tobacco: None  . Alcohol Use: No   OB History    Gravida Para Term Preterm AB TAB SAB Ectopic Multiple Living   Review of Systems  Constitutional: Negative for fever, chills, appetite change and fatigue.  HENT: Negative for congestion, postnasal drip and rhinorrhea.   Eyes: Negative for visual disturbance.  Respiratory: Negative for cough, chest tightness and shortness of breath.   Cardiovascular: Positive for chest pain.  Negative for palpitations and leg swelling.  Gastrointestinal: Negative for nausea, vomiting, abdominal pain and diarrhea.  Genitourinary: Negative for dysuria, urgency and hematuria.  Musculoskeletal: Negative for myalgias and back pain.  Skin: Negative for rash.  Neurological: Negative for dizziness, weakness, light-headedness and headaches.  Hematological: Does not bruise/bleed easily.      Allergies  Review of patient's allergies indicates no known allergies.  Home Medications   Prior to Admission medications   Medication Sig Start Date End Date Taking? Authorizing Provider  amoxicillin (AMOXIL) 500 MG capsule Take 1 capsule (500 mg total) by mouth 3 (three) times daily. 02/18/16  Yes Tharon Aquas, PA  norgestimate-ethinyl estradiol (MONONESSA) 0.25-35 MG-MCG tablet Take 1 tablet by mouth daily.   Yes Historical Provider, MD  oxyCODONE-acetaminophen (PERCOCET) 10-325 MG tablet Take 1 tablet by mouth every 4 (four) hours as needed for pain. Patient not taking: Reported on 05/22/2016 02/18/16   Tharon Aquas, PA   BP 119/87 mmHg  Pulse 71  Temp(Src) 98.4 F (36.9 C) (Oral)  Resp 18  Ht  (1.702 m)  Wt 175 lb (79.379 kg)  BMI 27.40 kg/m2  SpO2 99%  LMP 05/08/2016 Physical Exam  Constitutional: She is oriented to person, place, and time. She appears well-developed and well-nourished. No distress.  HENT:  Head: Normocephalic and atraumatic.  Right Ear: External ear normal.  Left Ear: External ear normal.  Nose: Nose normal.  Mouth/Throat: Oropharynx is clear and moist. No  oropharyngeal exudate.  Eyes: EOM are normal. Pupils are equal, round, and reactive to light.  Neck: Normal range of motion. Neck supple.  Cardiovascular: Normal rate, regular rhythm, normal heart sounds and intact distal pulses.   No murmur heard. Pulmonary/Chest: Effort normal. No respiratory distress. She has no wheezes. She has no rales. She exhibits no tenderness and no crepitus. Right breast  exhibits no inverted nipple, no mass, no nipple discharge, no skin change and no tenderness. Left breast exhibits no inverted nipple, no mass, no nipple discharge, no skin change and no tenderness. Breasts are symmetrical.  Abdominal: Soft. She exhibits no distension. There is no tenderness.  Musculoskeletal: Normal range of motion. She exhibits no edema or tenderness.  Neurological: She is alert and oriented to person, place, and time.  Skin: Skin is warm and dry. No rash noted. She is not diaphoretic.  Vitals reviewed.   ED Course  Procedures (including critical care time) Labs Review Labs Reviewed  CBC - Abnormal; Notable for the following:    RBC 3.47 (*)    Hemoglobin 11.5 (*)    HCT 34.2 (*)    All other components within normal limits  BASIC METABOLIC PANEL  I-STAT TROPOININ, ED  I-STAT BETA HCG BLOOD, ED ([facility], [facility], [facility] ONLY)    Imaging Review Dg Chest 2 View  05/22/2016  CLINICAL DATA:  Chest pain under left breast, intermittent for months, now worse. Hernia repair 6 weeks ago. EXAM: CHEST  2 VIEW COMPARISON:  None. FINDINGS: Cardiomediastinal silhouette is normal in size and configuration. Lungs are clear. Lung volumes are normal. No evidence of pneumonia. No pleural effusion. No pneumothorax. Osseous and soft tissue structures about the chest are unremarkable. IMPRESSION: Normal chest x-ray. Electronically Signed   By: Bary Richard M.D.   On: 05/22/2016 14:09   Ct Angio Chest Pe W/cm &/or Wo Cm  05/22/2016  CLINICAL DATA:  Chest pain primarily under the left breast. Evaluate for pulmonary embolism. EXAM: CT ANGIOGRAPHY CHEST WITH CONTRAST TECHNIQUE: Multidetector CT imaging of the chest was performed using the standard protocol during bolus administration of intravenous contrast. Multiplanar CT image reconstructions and MIPs were obtained to evaluate the vascular anatomy. CONTRAST:  80 cc Isovue 370 COMPARISON:  Chest radiograph - 05/22/2016 FINDINGS: Vascular Findings: There is  adequate opacification of the pulmonary arterial system with the main pulmonary artery measuring 242 Hounsfield units. There are no discrete filling defects within the pulmonary arterial tree to suggest pulmonary embolism. Normal caliber the main pulmonary artery. Normal heart size.  No pericardial effusion. Normal caliber of the thoracic aorta. No definite thoracic aortic dissection is nongated examination. Conventional configuration of the aortic arch. Incidental note is made of an azygos nipple. Review of the MIP images confirms the above findings. ---------------------------------------------------------------------------------- Nonvascular Findings: Mediastinum/Lymph Nodes: No bulky mediastinal, hilar axillary lymphadenopathy. Lungs/Pleura: Minimal dependent bilateral subpleural ground-glass atelectasis. No discrete focal airspace opacities. No pleural effusion or pneumothorax. The central pulmonary airways appear widely patent. No discrete pulmonary nodules. Upper abdomen: Limited early arterial phase evaluation of the upper abdomen is normal. Musculoskeletal: No acute or aggressive osseous abnormalities. Regional soft tissues appear normal with special attention paid to the caudal aspect of the left breast at the reported area of the patient's discomfort. Palpable. Normal appearance of the thyroid gland. IMPRESSION: No acute cardiopulmonary disease. Specifically, no evidence of pulmonary embolism. Electronically Signed   By: Simonne Come M.D.   On: 05/22/2016 15:50   I have personally reviewed and evaluated these images  and lab results as part of my medical decision-making.   EKG Interpretation   Date/Time:  Friday May 22 2016 13:12:14 EDT Ventricular Rate:  84 PR Interval:    QRS Duration: 70 QT Interval:  374 QTC Calculation: 443 R Axis:   58 Text Interpretation:  Sinus rhythm No previous ECGs available Confirmed by  Allex Madia ([id number]) on 05/22/2016 2:22:52 PM      MDM  Patient was  seen and evaluated in stable condition. Laboratory studies were unremarkable. EKG without acute ischemic changes. CTA negative for PE or other acute process. Concern for possible underlying heart disease in light of family history of sudden cardiac death in young individuals although no first-degree relatives. Discuss case with the cardiology PA on call who reportedly discussed the case with the cardiologist. I was interested in close follow-up for the patient and likely a 2-D echo to further evaluate the patient's heart structure. They said that they could help to facilitate outpatient follow-up and that the office should call the patient on Monday. I instructed the patient not to exercise her take part in strenuous activity until cleared by cardiology. She was given strict return precautions. She expressed understanding and agreement with plan of care. Final diagnoses:  None    1.  Chest pain    Leta BaptistEmily Roe Shunta Mclaurin, MD 05/23/16 [phone]

## 2016-05-29 ENCOUNTER — Encounter: Payer: Self-pay | Admitting: Cardiology

## 2016-05-31 NOTE — Progress Notes (Signed)
Cardiology Office Note   Date:  06/01/2016   ID:  Annette Floyd, DOB Mar 17, 1986, MRN [id number]  PCP:  Ok Anis, MD  Cardiologist:   Rollene Rotunda, MD   Chief Complaint  Patient presents with  . Chest Pain      History of Present Illness: Annette Floyd is a 30 y.o. female who presents for evaluation of chest pain.  The patient was in the ED recently with chest pain.  I reviewed these records.  She had a negative CT and had negative enzymes.  She reports that the symptoms have been going on for about 2 months.  They come and go and might happen for a few weeks and then go away.   She reports sharpt shooting pain.    These could be 8 out of 10 in intensity and feel like contractions.  She says they are under the left breast.  There is no radiation.  There are no associated symptoms.  They do not happen with exercise and she was able to go running yesterday without bringing on these symptoms.  She has had no prior cardiac work up and no past history.  She has no significant risk factors.    Past Medical History:  Diagnosis Date  . Ectopic pregnancy     Past Surgical History:  Procedure Laterality Date  . ABDOMINAL SURGERY     Tummy tuck  . BREAST REDUCTION SURGERY    . UNILATERAL SALPINGECTOMY Left 2009   ectopic     No current outpatient prescriptions on file.   No current facility-administered medications for this visit.     Allergies:   Review of patient's allergies indicates no known allergies.    Social History:  The patient  reports that she has never smoked. She does not have any smokeless tobacco history on file. She reports that she does not drink alcohol or use drugs.   Family History:  The patient's.family history is for the most part not significant.  Dads brother with pacemaker.    ROS:  Please see the history of present illness.   Otherwise, review of systems are positive for none.   All other systems are reviewed and negative.    PHYSICAL  EXAM: VS:  BP 105/72   Pulse 85   Ht  (1.702 m)   Wt 186 lb 9.6 oz (84.6 kg)   LMP 05/08/2016 Comment: negative HCG blood test 05-22-2016  BMI 29.23 kg/m  , BMI Body mass index is 29.23 kg/m. GENERAL:  Well appearing HEENT:  Pupils equal round and reactive, fundi not visualized, oral mucosa unremarkable NECK:  No jugular venous distention, waveform within normal limits, carotid upstroke brisk and symmetric, no bruits, no thyromegaly LYMPHATICS:  No cervical, inguinal adenopathy LUNGS:  Clear to auscultation bilaterally BACK:  No CVA tenderness CHEST:  Unremarkable HEART:  PMI not displaced or sustained,S1 and S2 within normal limits, no S3, no S4, no clicks, no rubs, no murmurs ABD:  Flat, positive bowel sounds normal in frequency in pitch, no bruits, no rebound, no guarding, no midline pulsatile mass, no hepatomegaly, no splenomegaly EXT:  2 plus pulses throughout, no edema, no cyanosis no clubbing SKIN:  No rashes no nodules NEURO:  Cranial nerves II through XII grossly intact, motor grossly intact throughout PSYCH:  Cognitively intact, oriented to person place and time    EKG:  EKG is not ordered today. The ekg ordered 7/21 demonstrates sinus rhythm, rate 84, axis within normal limits, intervals  within normal limits, no acute ST-T wave changes.   Recent Labs: 05/22/2016: BUN 17; Creatinine, Ser 0.82; Hemoglobin 11.5; Platelets 235; Potassium 4.0; Sodium 139    Lipid Panel No results found for: CHOL, TRIG, HDL, CHOLHDL, VLDL, LDLCALC, LDLDIRECT    Wt Readings from Last 3 Encounters:  06/01/16 186 lb 9.6 oz (84.6 kg)  05/22/16 175 lb (79.4 kg)  12/28/15 173 lb 12.8 oz (78.8 kg)      Other studies Reviewed: Additional studies/ records that were reviewed today include: ED records. . Review of the above records demonstrates:  Please see elsewhere in the note.     ASSESSMENT AND PLAN:  CHEST PAIN:  The pretest probability of obstructive CAD is very low.  She has  very atypical symptoms and no risk factors.  The exam and EKG are negative.  No further cardiovascular testing is indicated.  She should go back to her PCP and discuss possible GI etiologies.    RISK REDUCTION:  I did suggest a lipid profile for baseline but will defer to Ok Anis, MD   Current medicines are reviewed at length with the patient today.  The patient does not have concerns regarding medicines.  The following changes have been made:  no change  Labs/ tests ordered today include: None No orders of the defined types were placed in this encounter.    Disposition:   FU with me as needed.      Signed, Rollene Rotunda, MD  06/01/2016 8:45 AM    [facility]

## 2016-06-01 ENCOUNTER — Encounter: Payer: Self-pay | Admitting: Cardiology

## 2016-06-01 ENCOUNTER — Ambulatory Visit (HOSPITAL_COMMUNITY)
Admission: EM | Admit: 2016-06-01 | Discharge: 2016-06-01 | Disposition: A | Discharge status: Home / Self Care | Payer: Medicare Other | Attending: Family Medicine | Admitting: Family Medicine

## 2016-06-01 ENCOUNTER — Encounter (HOSPITAL_COMMUNITY): Payer: Self-pay | Admitting: Emergency Medicine

## 2016-06-01 ENCOUNTER — Ambulatory Visit (INDEPENDENT_AMBULATORY_CARE_PROVIDER_SITE_OTHER): Payer: Medicare Other | Admitting: Cardiology

## 2016-06-01 VITALS — BP 105/72 | HR 85 | Ht 67.0 in | Wt 186.6 lb

## 2016-06-01 DIAGNOSIS — K0889 Other specified disorders of teeth and supporting structures: Secondary | ICD-10-CM | POA: Diagnosis not present

## 2016-06-01 DIAGNOSIS — R0789 Other chest pain: Secondary | ICD-10-CM | POA: Diagnosis not present

## 2016-06-01 DIAGNOSIS — R079 Chest pain, unspecified: Secondary | ICD-10-CM | POA: Insufficient documentation

## 2016-06-01 DIAGNOSIS — K047 Periapical abscess without sinus: Secondary | ICD-10-CM | POA: Diagnosis not present

## 2016-06-01 MED ORDER — NAPROXEN 500 MG PO TABS: 500.0000 mg | ORAL_TABLET | Freq: Two times a day (BID) | ORAL | 0 refills | Status: DC

## 2016-06-01 MED ORDER — KETOROLAC TROMETHAMINE 60 MG/2ML IM SOLN
INTRAMUSCULAR | Status: AC
  Filled 2016-06-01: qty 2

## 2016-06-01 MED ORDER — AMOXICILLIN 875 MG PO TABS: 875.0000 mg | ORAL_TABLET | Freq: Two times a day (BID) | ORAL | 0 refills | Status: DC

## 2016-06-01 MED ORDER — KETOROLAC TROMETHAMINE 60 MG/2ML IM SOLN
60.0000 mg | Freq: Once | INTRAMUSCULAR | Status: AC
  Administered 2016-06-01: 60 mg via INTRAMUSCULAR

## 2016-06-01 MED ORDER — FLUCONAZOLE 150 MG PO TABS: 150.0000 mg | ORAL_TABLET | Freq: Every day | ORAL | 0 refills | Status: DC

## 2016-06-01 NOTE — ED Provider Notes (Signed)
CSN: [id number]     Arrival date & time 06/01/16  1352 History   None    Chief Complaint  Patient presents with  . Dental Pain   (Consider location/radiation/quality/duration/timing/severity/associated sxs/prior Treatment) Patient has left upper jaw and tooth pain for 2 days.   The history is provided by the patient.  Dental Pain  Location:  Upper Upper teeth location:  16/LU 3rd molar Severity:  Moderate Onset quality:  Gradual Duration:  10 days Timing:  Constant Progression:  Worsening Chronicity:  New Previous work-up:  Dental exam Relieved by:  None tried Worsened by:  Nothing Ineffective treatments:  None tried   Past Medical History:  Diagnosis Date  . Ectopic pregnancy    Past Surgical History:  Procedure Laterality Date  . ABDOMINAL SURGERY     Tummy tuck  . BREAST REDUCTION SURGERY    . UNILATERAL SALPINGECTOMY Left 2009   ectopic   No family history on file. Social History  Substance Use Topics  . Smoking status: Never Smoker  . Smokeless tobacco: Never Used  . Alcohol use No   OB History    Gravida Para Term Preterm AB Living   4 2 1 1 1 1    SAB TAB Ectopic Multiple Live Births       1   1     Review of Systems  Constitutional: Negative.   HENT: Positive for dental problem.   Eyes: Negative.   Respiratory: Negative.   Cardiovascular: Negative.   Gastrointestinal: Negative.   Endocrine: Negative.   Genitourinary: Negative.   Musculoskeletal: Negative.   Skin: Negative.   Allergic/Immunologic: Negative.   Neurological: Negative.   Hematological: Negative.   Psychiatric/Behavioral: Negative.     Allergies  Review of patient's allergies indicates no known allergies.  Home Medications   Prior to Admission medications   Medication Sig Start Date End Date Taking? Authorizing Provider  acetaminophen (TYLENOL) 325 MG tablet Take 650 mg by mouth every 6 (six) hours as needed.   Yes Historical Provider, MD  amoxicillin (AMOXIL) 875 MG  tablet Take 1 tablet (875 mg total) by mouth 2 (two) times daily. 06/01/16   Deatra Canter, FNP  naproxen (NAPROSYN) 500 MG tablet Take 1 tablet (500 mg total) by mouth 2 (two) times daily with a meal. 06/01/16   Deatra Canter, FNP   Meds Ordered and Administered this Visit   Medications  ketorolac (TORADOL) injection 60 mg (not administered)    BP 113/85 (BP Location: Right Arm)   Pulse 78   Temp 98.3 F (36.8 C) (Oral)   Resp 12   LMP 05/08/2016 Comment: negative HCG blood test 05-22-2016  SpO2 96%  No data found.   Physical Exam  Urgent Care Course   Clinical Course    Procedures (including critical care time)  Labs Review Labs Reviewed - No data to display  Imaging Review No results found.   Visual Acuity Review  Right Eye Distance:   Left Eye Distance:   Bilateral Distance:    Right Eye Near:   Left Eye Near:    Bilateral Near:         MDM   1. Dental abscess   2. Tooth pain    toradol 60mg  IM Naprosyn 500mg  one po bid prn pain #20 Amoxicillin 875mg  one po bid x 10 days #20     Deatra Canter, FNP 06/01/16 1538

## 2016-06-01 NOTE — Patient Instructions (Signed)
Your physician recommends that you schedule a follow-up appointment in: As Needed    

## 2016-06-01 NOTE — ED Triage Notes (Signed)
Patient reports she is scheduled for a root canal September 28.  Patient has had discomfort for 1 1/2 weeks.  Patient reports pain left upper and left lower.

## 2016-06-01 NOTE — ED Notes (Signed)
On discharge requested script for possible yeast infection.  notified bill oxford, np of this request.

## 2016-08-05 IMAGING — US US OB TRANSVAGINAL
1 series · 15 of 26 positions shown · non-contrast
Comparison: No images available.

CLINICAL DATA: Pregnant patient in first-trimester pregnancy with
right lower quadrant and back pain. Pregnancy of unknown location.

EXAM:
TRANSVAGINAL OB ULTRASOUND
TECHNIQUE: Transvaginal ultrasound was performed for complete evaluation of the
gestation as well as the maternal uterus, adnexal regions, and
pelvic cul-de-sac.

[Series 1: us ob transvaginal · 26 acquisitions, 15 frames shown]
[im 1/26]
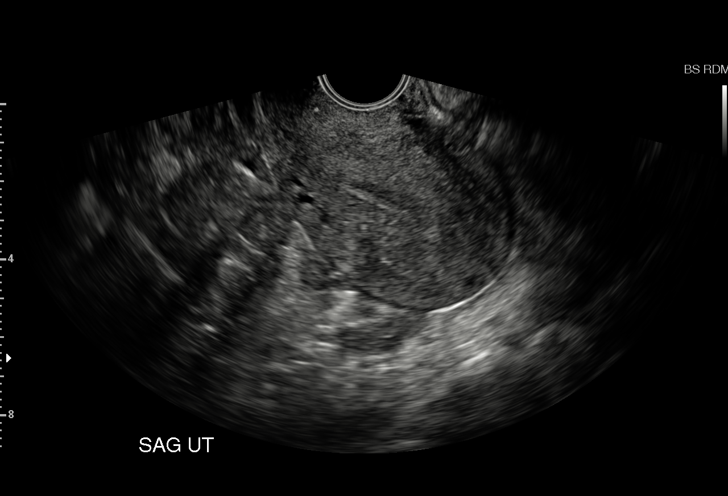
[im 3/26]
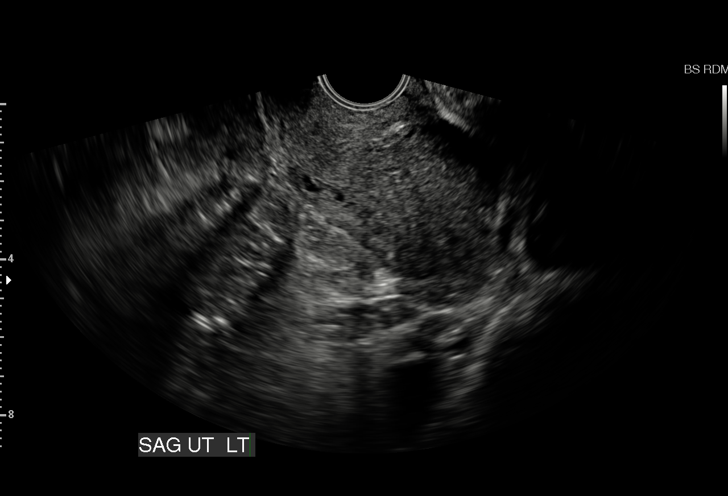
[im 5/26]
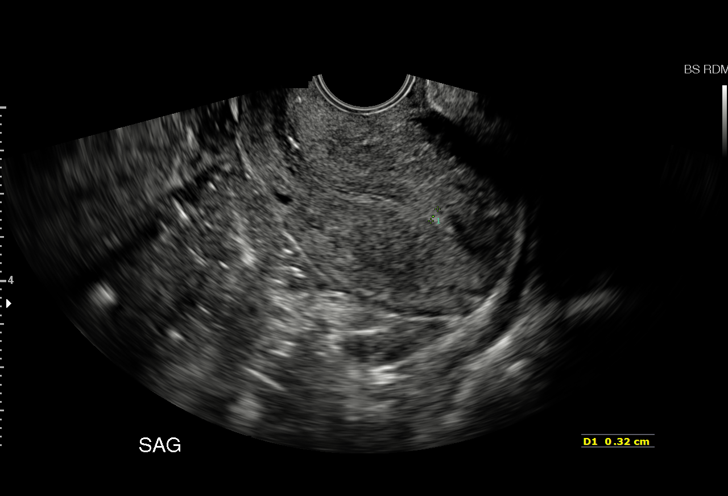
[im 7/26]
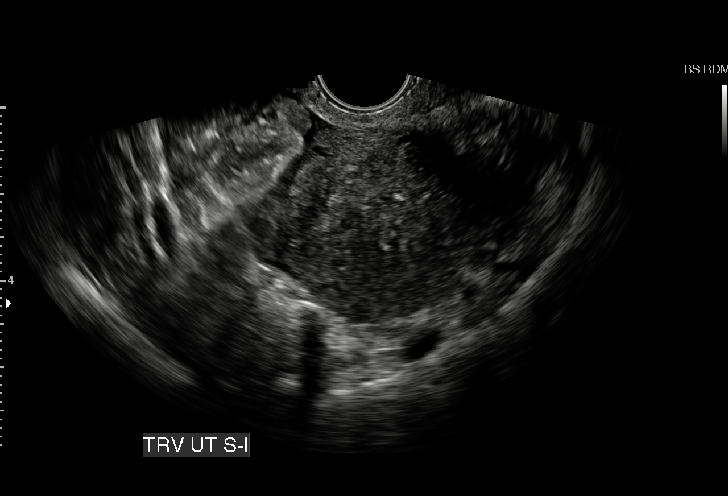
[im 8/26]
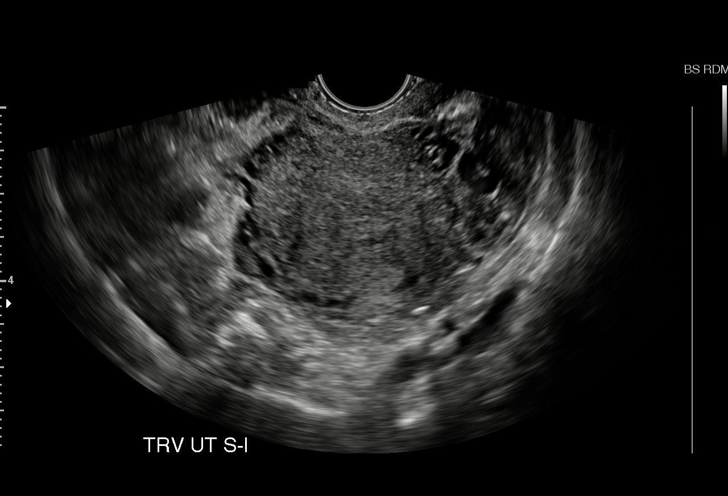
[im 10/26]
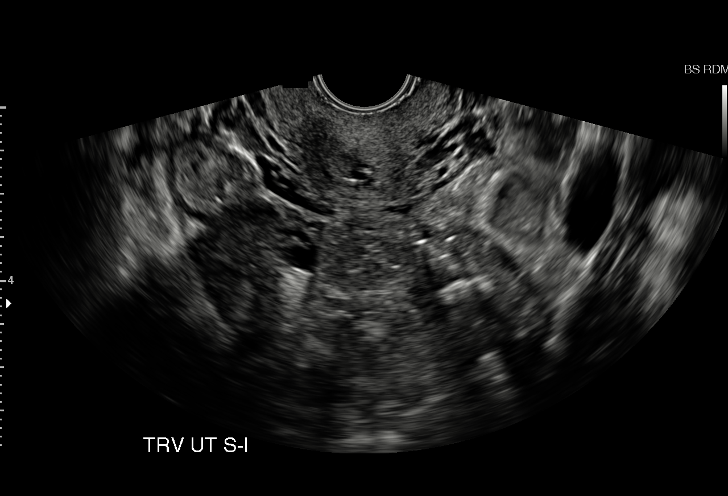
[im 12/26]
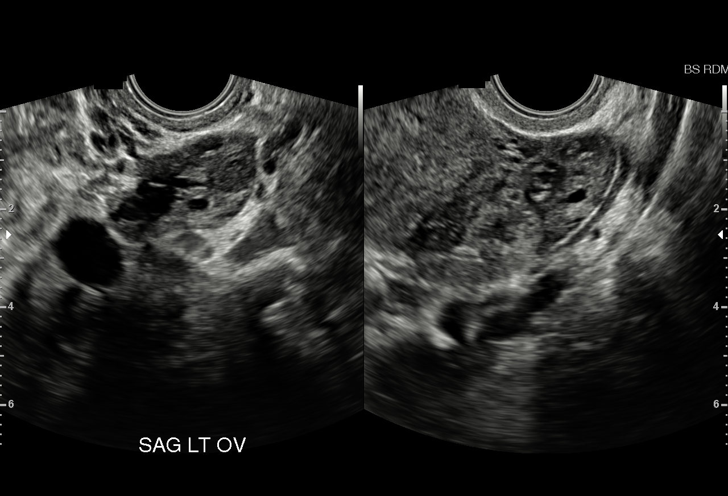
[im 14/26]
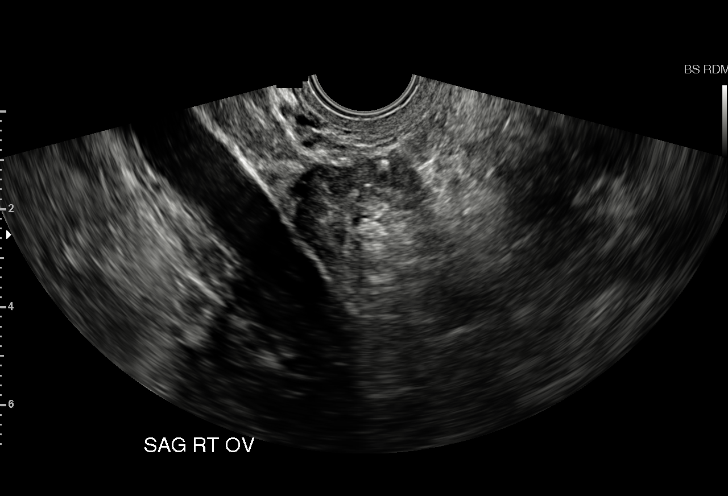
[im 15/26]
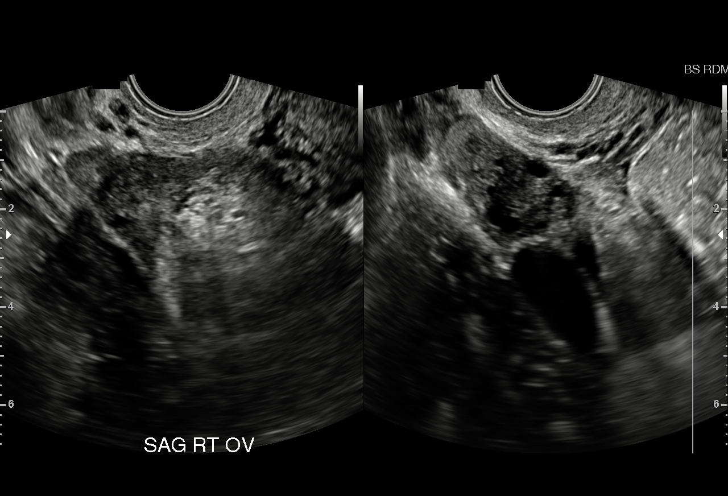
[im 17/26]
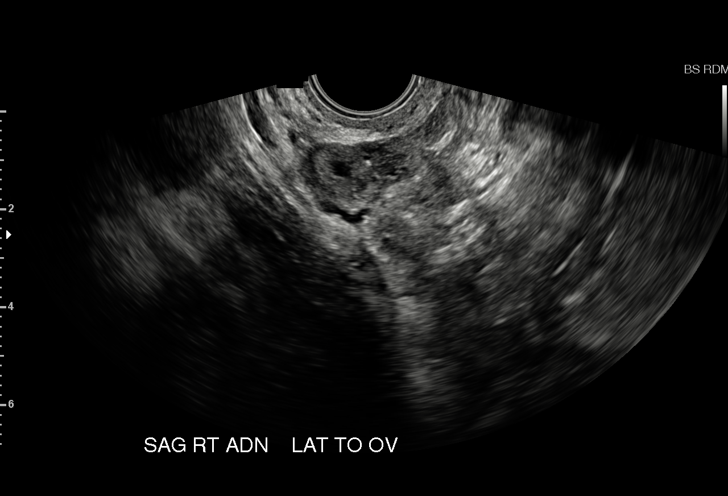
[im 19/26]
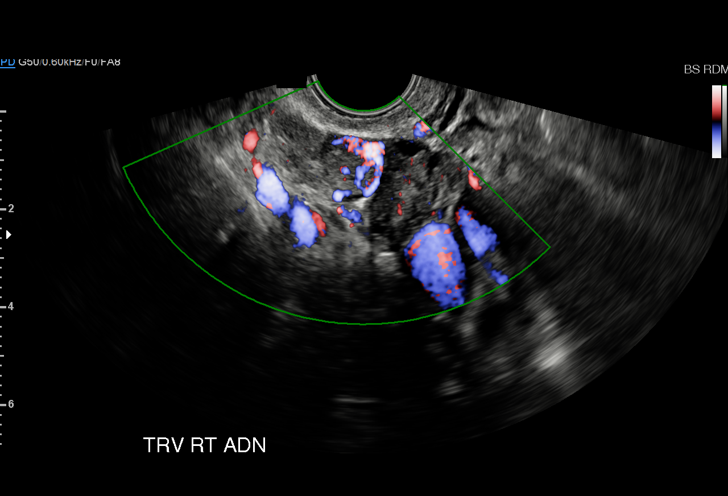
[im 20/26]
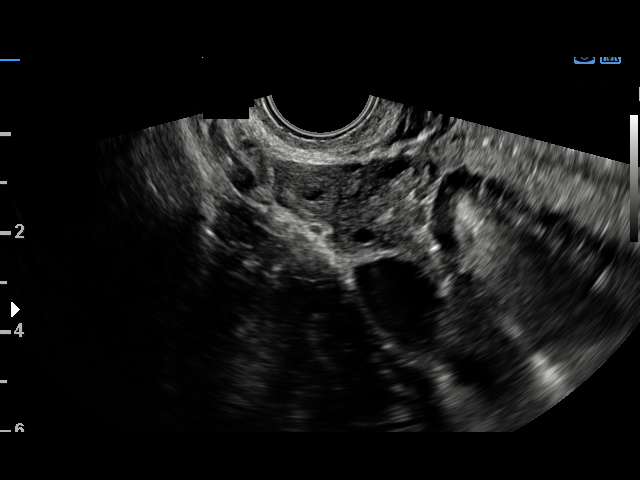
[im 22/26]
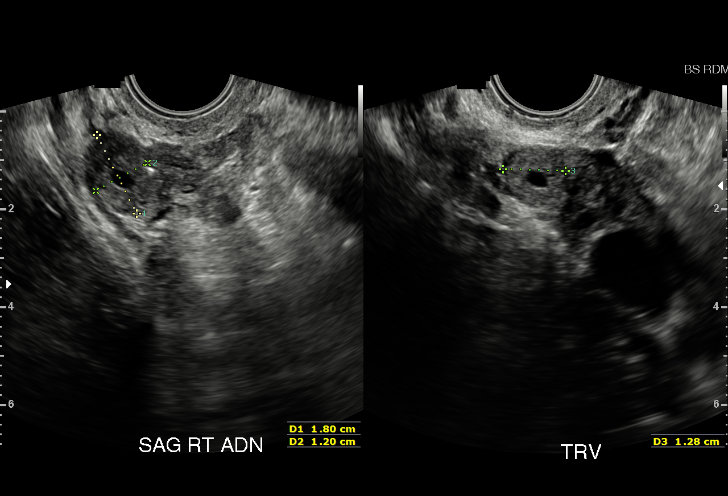
[im 24/26]
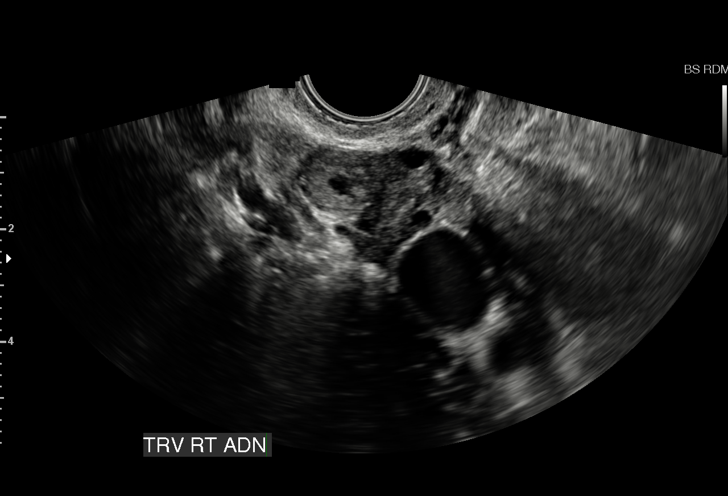
[im 26/26]
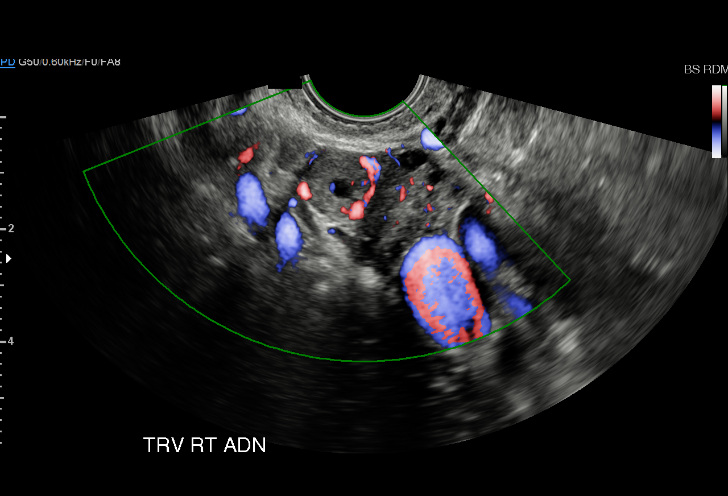

[15 of 26 positions shown; findings below may reference images not displayed]

Report from ultrasound 12/20/2015
demonstrating hyperechoic ovoid mass in the right adnexa.
FINDINGS: Intrauterine gestational sac: Not visualized.

Yolk sac:  Not visualized.

Embryo:  Not visualized.

Cardiac Activity: Not visualized.

Maternal uterus/adnexae: The endometrium is thin measuring 3 mm. The
right ovary measures 1.4 x 1.9 x 2.0 cm. No fluid in the endometrial
canal. Adjacent to the right ovary is an echogenic adnexal mass with
central hypoechoic structure measuring 1.8 x 1.3 x 1.2 cm. There is
internal blood flow. The left ovary is normal. There is no pelvic
free fluid.
IMPRESSION: Findings concerning for right adnexal ectopic pregnancy. No evidence
of rupture.

Critical Value/emergent results were called by telephone at the time
verbally acknowledged these results.

## 2016-12-29 IMAGING — CT CT ANGIO CHEST
3 of 7 series · 18 of 36 positions shown · IV contrast (isovue)
Comparison: Chest radiograph - 05/22/2016

CLINICAL DATA: Chest pain primarily under the left breast. Evaluate
for pulmonary embolism.

EXAM:
CT ANGIOGRAPHY CHEST WITH CONTRAST
TECHNIQUE: Multidetector CT imaging of the chest was performed using the
standard protocol during bolus administration of intravenous
contrast. Multiplanar CT image reconstructions and MIPs were
obtained to evaluate the vascular anatomy.
CONTRAST:  80 cc Isovue 370

[Series 5: coronal mpr · coronal · 0.60mm/px · 1 of 89 slices shown]
[im 45/89  mediastinal]
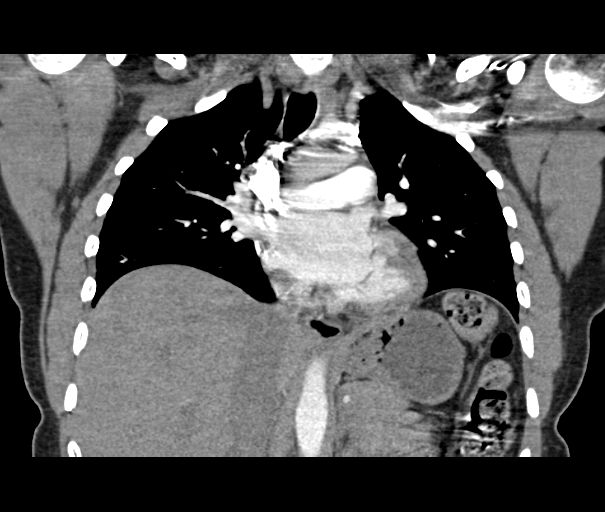

[Series 10: thins for pacs · axial · 0.70mm/px · z∈[-277,-67]mm · 15 of 242 slices shown]
[im 16/242  lung]
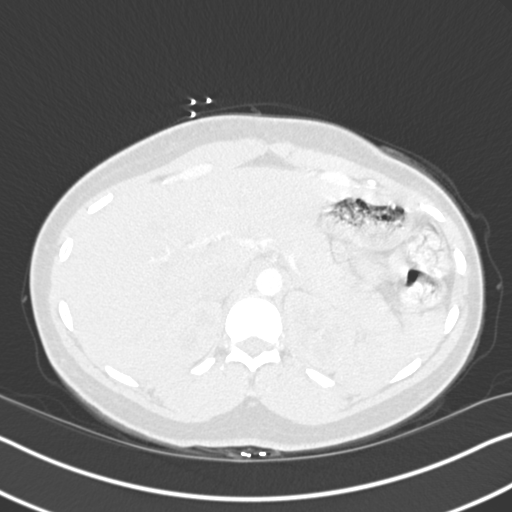
[im 31/242  mediastinal]
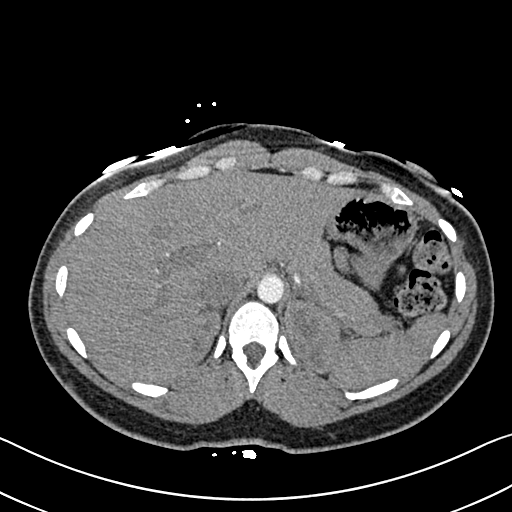
[im 46/242  lung]
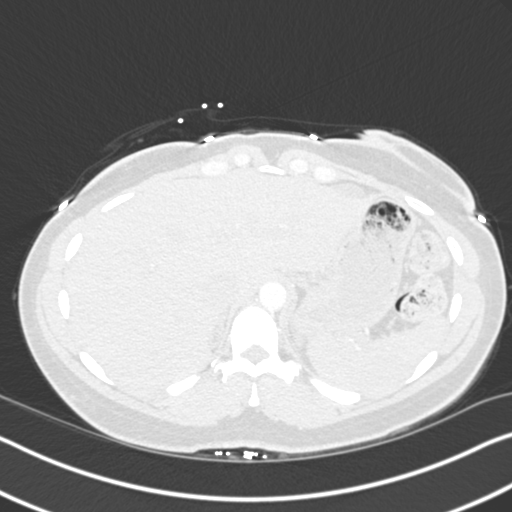
[im 61/242  mediastinal]
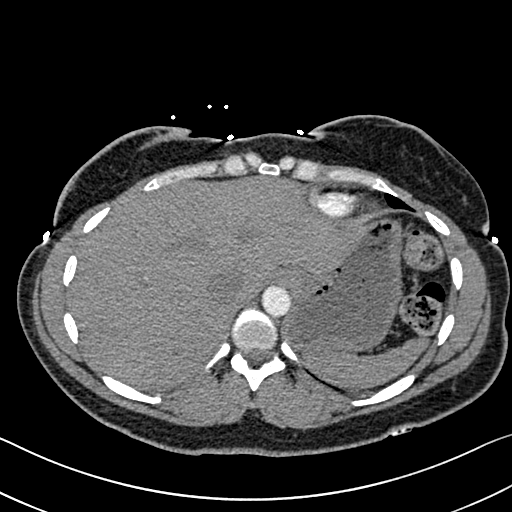
[im 76/242  lung]
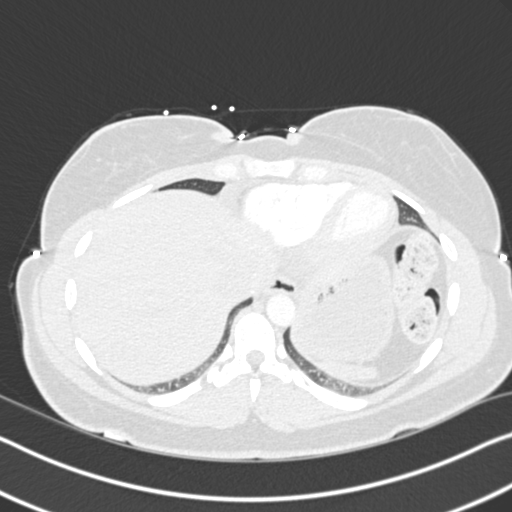
[im 91/242  mediastinal]
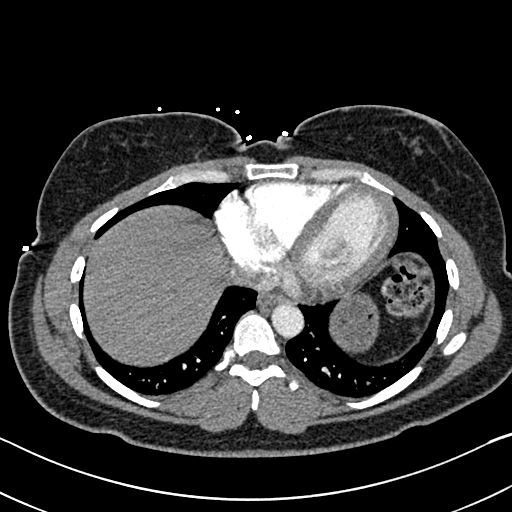
[im 106/242  lung]
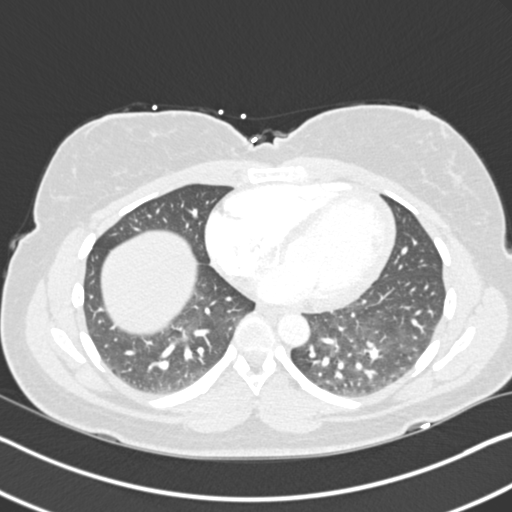
[im 121/242  mediastinal]
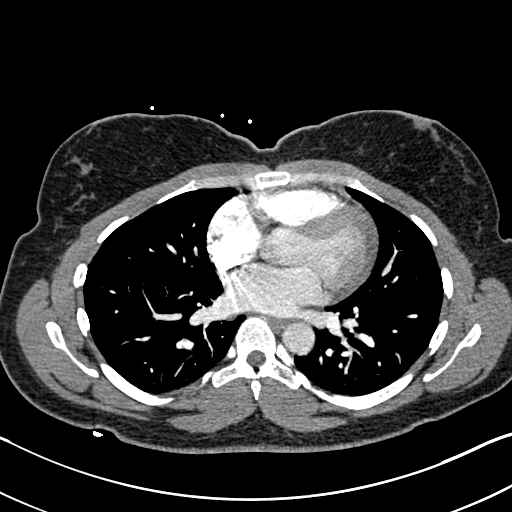
[im 136/242  lung]
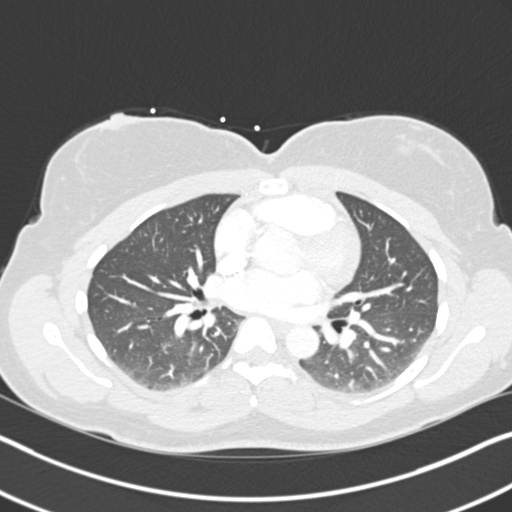
[im 151/242  mediastinal]
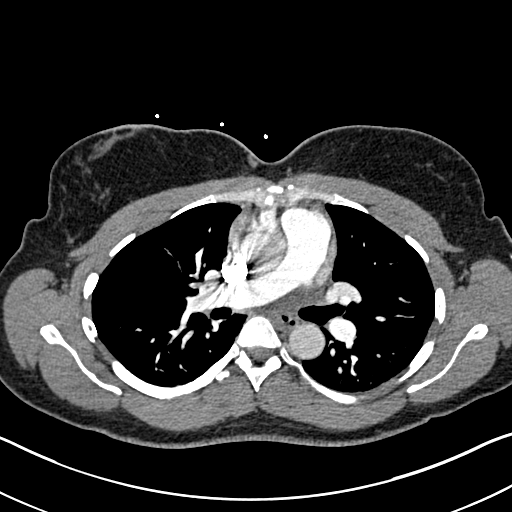
[im 166/242  lung]
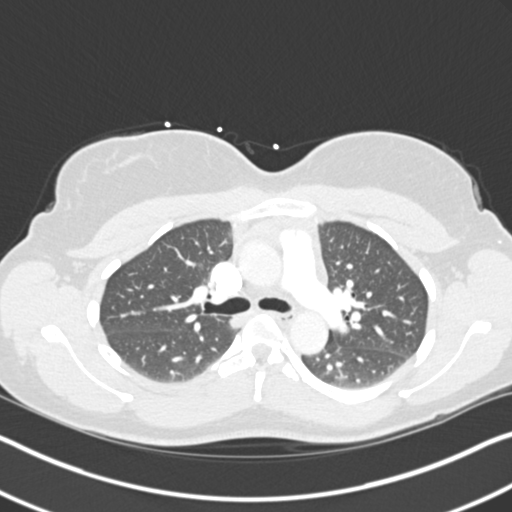
[im 181/242  mediastinal]
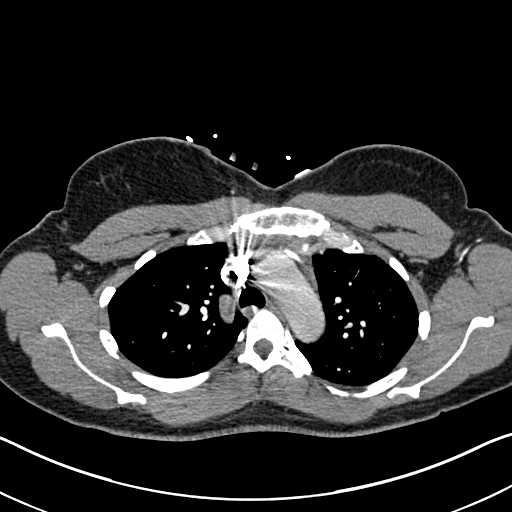
[im 196/242  lung]
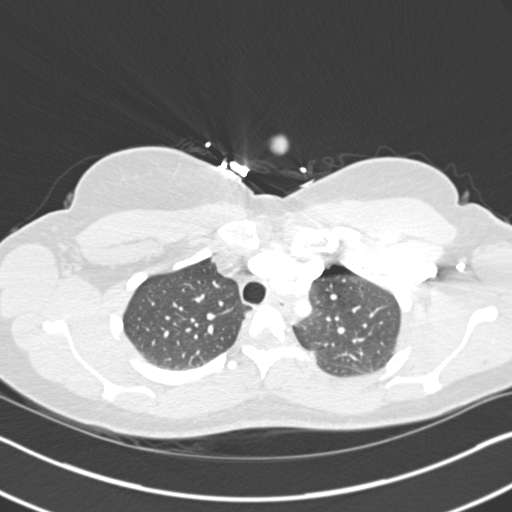
[im 211/242  mediastinal]
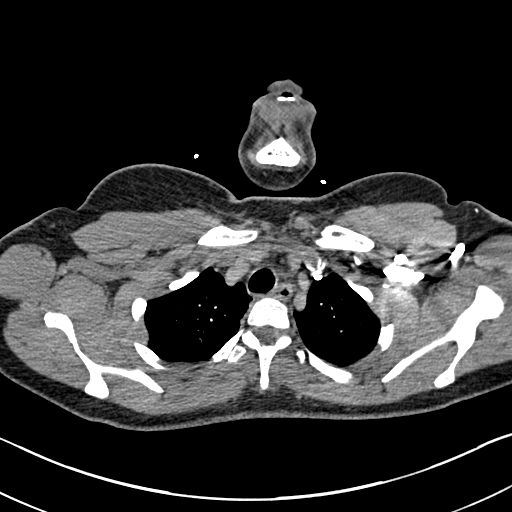
[im 226/242  lung]
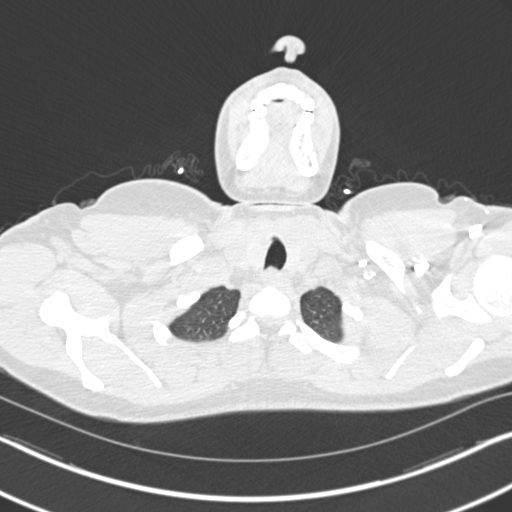

[Series 11: lung windows · axial · 0.70mm/px · z∈[-199,-151]mm · 2 of 66 slices shown]
[im 17/66  mediastinal]
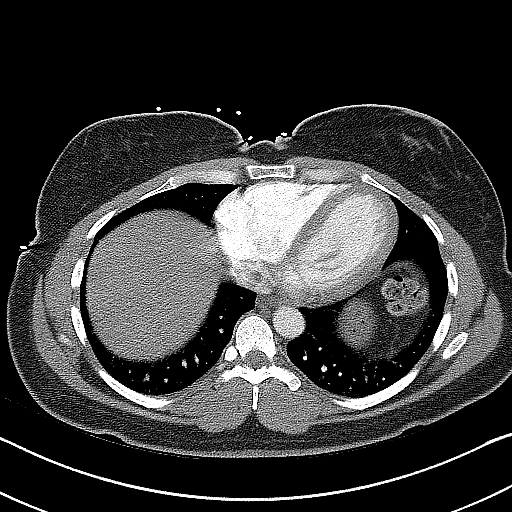
[im 33/66  mediastinal]
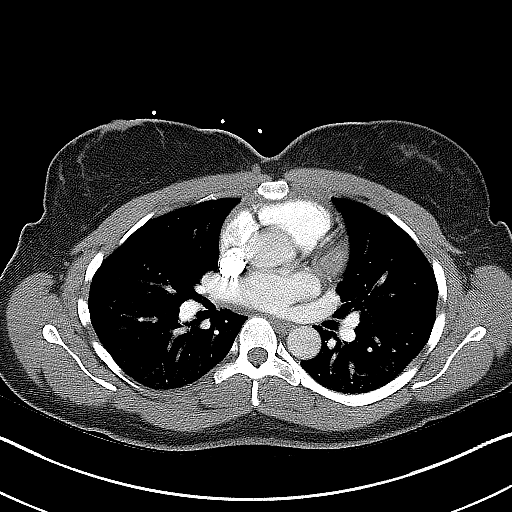

[18 of 36 positions shown; findings below may reference images not displayed]

FINDINGS: Vascular Findings:

There is adequate opacification of the pulmonary arterial system
with the main pulmonary artery measuring 242 Hounsfield units. There
are no discrete filling defects within the pulmonary arterial tree
to suggest pulmonary embolism. Normal caliber the main pulmonary
artery.

Normal heart size.  No pericardial effusion.

Normal caliber of the thoracic aorta. No definite thoracic aortic
dissection is nongated examination. Conventional configuration of
the aortic arch. Incidental note is made of an azygos nipple.

Review of the MIP images confirms the above findings.

----------------------------------------------------------------------------------

Nonvascular Findings:

Mediastinum/Lymph Nodes: No bulky mediastinal, hilar axillary
lymphadenopathy.

Lungs/Pleura: Minimal dependent bilateral subpleural ground-glass
atelectasis. No discrete focal airspace opacities. No pleural
effusion or pneumothorax. The central pulmonary airways appear
widely patent.

No discrete pulmonary nodules.

Upper abdomen: Limited early arterial phase evaluation of the upper
abdomen is normal.

Musculoskeletal: No acute or aggressive osseous abnormalities.

Regional soft tissues appear normal with special attention paid to
the caudal aspect of the left breast at the reported area of the
patient's discomfort. Palpable. Normal appearance of the thyroid
gland.
IMPRESSION: No acute cardiopulmonary disease. Specifically, no evidence of
pulmonary embolism.

## 2017-01-01 ENCOUNTER — Encounter (HOSPITAL_COMMUNITY): Payer: Self-pay | Admitting: Family Medicine

## 2017-01-01 ENCOUNTER — Ambulatory Visit (HOSPITAL_COMMUNITY)
Admission: EM | Admit: 2017-01-01 | Discharge: 2017-01-01 | Disposition: A | Discharge status: Home / Self Care | Payer: Medicare Other | Attending: Family Medicine | Admitting: Family Medicine

## 2017-01-01 DIAGNOSIS — S025XXB Fracture of tooth (traumatic), initial encounter for open fracture: Secondary | ICD-10-CM

## 2017-01-01 MED ORDER — DICLOFENAC POTASSIUM 50 MG PO TABS: 50.0000 mg | ORAL_TABLET | Freq: Three times a day (TID) | ORAL | 0 refills | Status: AC

## 2017-01-01 MED ORDER — FLUCONAZOLE 150 MG PO TABS: 150.0000 mg | ORAL_TABLET | Freq: Once | ORAL | 1 refills | Status: AC

## 2017-01-01 MED ORDER — CLINDAMYCIN HCL 150 MG PO CAPS: 150.0000 mg | ORAL_CAPSULE | Freq: Four times a day (QID) | ORAL | 0 refills | Status: AC

## 2017-01-01 MED ORDER — BUPIVACAINE-EPINEPHRINE (PF) 0.5% -1:200000 IJ SOLN
INTRAMUSCULAR | Status: AC
  Filled 2017-01-01: qty 1.8

## 2017-01-01 NOTE — ED Provider Notes (Addendum)
[facility]    CSN: [id number] Arrival date & time: 01/01/17  1653     History   Chief Complaint Chief Complaint  Patient presents with  . Dental Pain    HPI Annette Floyd is a 31 y.o. female.   The history is provided by the patient.  Dental Pain  Location:  Lower Lower teeth location:  29/RL 2nd bicuspid Quality:  Throbbing Severity:  Mild Onset quality:  Sudden Duration:  1 week Progression:  Worsening Chronicity:  New (seen by dentist 1 week ago,  pt wants to save tooth with rootcanal but must wait until next mo.) Context: dental fracture and poor dentition   Relieved by:  None tried Worsened by:  Touching Associated symptoms: no fever     Past Medical History:  Diagnosis Date  . Ectopic pregnancy     Patient Active Problem List   Diagnosis Date Noted  . Chest pain 06/01/2016    Past Surgical History:  Procedure Laterality Date  . ABDOMINAL SURGERY     Tummy tuck  . BREAST REDUCTION SURGERY    . UNILATERAL SALPINGECTOMY Left 2009   ectopic    OB History    Gravida Para Term Preterm AB Living   4 2 1 1 1 1    SAB TAB Ectopic Multiple Live Births       1   1       Home Medications    Prior to Admission medications   Medication Sig Start Date End Date Taking? Authorizing Provider  clindamycin (CLEOCIN) 150 MG capsule Take 1 capsule (150 mg total) by mouth 4 (four) times daily. 01/01/17   Linna Hoff, MD  diclofenac (CATAFLAM) 50 MG tablet Take 1 tablet (50 mg total) by mouth 3 (three) times daily. 01/01/17   Linna Hoff, MD  fluconazole (DIFLUCAN) 150 MG tablet Take 1 tablet (150 mg total) by mouth once. 01/01/17 01/01/17  Linna Hoff, MD    Family History History reviewed. No pertinent family history.  Social History Social History  Substance Use Topics  . Smoking status: Never Smoker  . Smokeless tobacco: Never Used  . Alcohol use No     Allergies   Patient has no known allergies.   Review of Systems Review of Systems    Constitutional: Negative.  Negative for fever.  HENT: Positive for dental problem. Negative for postnasal drip and rhinorrhea.      Physical Exam Triage Vital Signs ED Triage Vitals  Enc Vitals Group     BP 01/01/17 1703 120/88     Pulse Rate 01/01/17 1703 88     Resp 01/01/17 1703 18     Temp 01/01/17 1703 98 F (36.7 C)     Temp src --      SpO2 01/01/17 1703 100 %     Weight --      Height --      Head Circumference --      Peak Flow --      Pain Score 01/01/17 1705 9     Pain Loc --      Pain Edu? --      Excl. in GC? --    No data found.   Updated Vital Signs BP 120/88   Pulse 88   Temp 98 F (36.7 C)   Resp 18   LMP 12/29/2016   SpO2 100%   Visual Acuity Right Eye Distance:   Left Eye Distance:   Bilateral Distance:  Right Eye Near:   Left Eye Near:    Bilateral Near:     Physical Exam  Constitutional: She appears well-developed and well-nourished.  HENT:  Mouth/Throat: Oropharynx is clear and moist. Abnormal dentition.    Nursing note and vitals reviewed.    UC Treatments / Results  Labs (all labs ordered are listed, but only abnormal results are displayed) Labs Reviewed - No data to display  EKG  EKG Interpretation None       Radiology No results found.  Procedures Procedures (including critical care time)  Medications Ordered in UC Medications - No data to display   Initial Impression / Assessment and Plan / UC Course  I have reviewed the triage vital signs and the nursing notes.  Pertinent labs & imaging results that were available during my care of the patient were reviewed by me and considered in my medical decision making (see chart for details).   dental block requested by pt - bupivacaine with epi  Performed successfully  Wow now I can sleep.    Final Clinical Impressions(s) / UC Diagnoses   Final diagnoses:  Open fracture of tooth, initial encounter    New Prescriptions New Prescriptions   CLINDAMYCIN  (CLEOCIN) 150 MG CAPSULE    Take 1 capsule (150 mg total) by mouth 4 (four) times daily.   DICLOFENAC (CATAFLAM) 50 MG TABLET    Take 1 tablet (50 mg total) by mouth 3 (three) times daily.   FLUCONAZOLE (DIFLUCAN) 150 MG TABLET    Take 1 tablet (150 mg total) by mouth once.     Shalan Neault D Keyle Doby, MD 03/0Linna Hoff2/18 1728    Linna HoffJames D Barrie Wale, MD 01/01/17 [phone]

## 2017-01-01 NOTE — ED Triage Notes (Signed)
Pt here with right lower dental pain due to crack in tooth.

## 2017-01-01 NOTE — ED Notes (Signed)
Pt requesting stronger pain meds than given in discharge prescription. Spoke with MD and made clear pt isnt getting anything stronger than what was prescribed. Pt offered dental block and accepted. Pt left relieved and sts she will be able to sleep tonight. Told to follow up with dentist ASAP.

## 2017-01-01 NOTE — Discharge Instructions (Signed)
See dentist as planned, sooner if problems

## 2019-05-06 ENCOUNTER — Other Ambulatory Visit: Payer: Self-pay

## 2019-05-06 ENCOUNTER — Ambulatory Visit
Admission: EM | Admit: 2019-05-06 | Discharge: 2019-05-06 | Disposition: A | Discharge status: Home / Self Care | Payer: Medicare Other | Attending: Family Medicine | Admitting: Family Medicine

## 2019-05-06 DIAGNOSIS — S3994XA Unspecified injury of external genitals, initial encounter: Secondary | ICD-10-CM
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
## 2019-05-06 NOTE — Discharge Instructions (Addendum)
Go directly to Emergency room as discussed.  °
